# Patient Record
Sex: Female | Born: 1935 | Race: White | Hispanic: No | Smoking: Never smoker
Health system: Southern US, Community
[De-identification: ages and names within clinical notes are randomized; demographics above are authoritative.]

## PROBLEM LIST (undated history)

## (undated) DIAGNOSIS — Z86711 Personal history of pulmonary embolism: Secondary | ICD-10-CM

## (undated) DIAGNOSIS — H9190 Unspecified hearing loss, unspecified ear: Secondary | ICD-10-CM

## (undated) DIAGNOSIS — I10 Essential (primary) hypertension: Secondary | ICD-10-CM

## (undated) DIAGNOSIS — F0280 Dementia in other diseases classified elsewhere without behavioral disturbance: Secondary | ICD-10-CM

## (undated) DIAGNOSIS — M1990 Unspecified osteoarthritis, unspecified site: Secondary | ICD-10-CM

## (undated) DIAGNOSIS — N183 Chronic kidney disease, stage 3 unspecified: Secondary | ICD-10-CM

## (undated) DIAGNOSIS — E119 Type 2 diabetes mellitus without complications: Secondary | ICD-10-CM

## (undated) DIAGNOSIS — G309 Alzheimer's disease, unspecified: Secondary | ICD-10-CM

## (undated) DIAGNOSIS — E039 Hypothyroidism, unspecified: Secondary | ICD-10-CM

## (undated) DIAGNOSIS — K08109 Complete loss of teeth, unspecified cause, unspecified class: Secondary | ICD-10-CM

## (undated) DIAGNOSIS — N289 Disorder of kidney and ureter, unspecified: Secondary | ICD-10-CM

## (undated) DIAGNOSIS — K219 Gastro-esophageal reflux disease without esophagitis: Secondary | ICD-10-CM

## (undated) DIAGNOSIS — F419 Anxiety disorder, unspecified: Secondary | ICD-10-CM

## (undated) DIAGNOSIS — G4700 Insomnia, unspecified: Secondary | ICD-10-CM

## (undated) DIAGNOSIS — F32A Depression, unspecified: Secondary | ICD-10-CM

## (undated) DIAGNOSIS — Z973 Presence of spectacles and contact lenses: Secondary | ICD-10-CM

## (undated) DIAGNOSIS — R32 Unspecified urinary incontinence: Secondary | ICD-10-CM

## (undated) DIAGNOSIS — Z972 Presence of dental prosthetic device (complete) (partial): Secondary | ICD-10-CM

## (undated) DIAGNOSIS — I4891 Unspecified atrial fibrillation: Secondary | ICD-10-CM

## (undated) DIAGNOSIS — F329 Major depressive disorder, single episode, unspecified: Secondary | ICD-10-CM

---

## 2003-10-23 ENCOUNTER — Observation Stay (HOSPITAL_COMMUNITY): Admission: EM | Admit: 2003-10-23 | Discharge: 2003-10-26 | Payer: Self-pay | Admitting: Emergency Medicine

## 2003-11-29 ENCOUNTER — Ambulatory Visit: Payer: Self-pay | Admitting: Internal Medicine

## 2004-02-09 DIAGNOSIS — Z86711 Personal history of pulmonary embolism: Secondary | ICD-10-CM

## 2004-02-09 HISTORY — DX: Personal history of pulmonary embolism: Z86.711

## 2004-03-03 ENCOUNTER — Ambulatory Visit: Payer: Self-pay | Admitting: Internal Medicine

## 2004-03-03 ENCOUNTER — Encounter: Admission: RE | Admit: 2004-03-03 | Discharge: 2004-03-03 | Payer: Self-pay | Admitting: Internal Medicine

## 2004-03-17 ENCOUNTER — Ambulatory Visit: Payer: Self-pay

## 2004-03-17 ENCOUNTER — Encounter: Admission: RE | Admit: 2004-03-17 | Discharge: 2004-03-17 | Payer: Self-pay | Admitting: Internal Medicine

## 2004-03-24 ENCOUNTER — Ambulatory Visit: Payer: Self-pay | Admitting: Internal Medicine

## 2004-04-01 ENCOUNTER — Encounter: Admission: RE | Admit: 2004-04-01 | Discharge: 2004-04-01 | Payer: Self-pay | Admitting: Internal Medicine

## 2004-04-09 ENCOUNTER — Ambulatory Visit (HOSPITAL_COMMUNITY): Admission: RE | Admit: 2004-04-09 | Discharge: 2004-04-09 | Payer: Self-pay | Admitting: Internal Medicine

## 2004-04-14 ENCOUNTER — Ambulatory Visit (HOSPITAL_COMMUNITY): Admission: RE | Admit: 2004-04-14 | Discharge: 2004-04-14 | Payer: Self-pay | Admitting: Interventional Radiology

## 2004-04-14 ENCOUNTER — Encounter (INDEPENDENT_AMBULATORY_CARE_PROVIDER_SITE_OTHER): Payer: Self-pay | Admitting: Specialist

## 2004-04-20 ENCOUNTER — Ambulatory Visit: Payer: Self-pay | Admitting: Internal Medicine

## 2004-04-20 ENCOUNTER — Inpatient Hospital Stay (HOSPITAL_COMMUNITY): Admission: EM | Admit: 2004-04-20 | Discharge: 2004-04-29 | Payer: Self-pay | Admitting: Emergency Medicine

## 2004-04-30 ENCOUNTER — Ambulatory Visit: Payer: Self-pay | Admitting: Internal Medicine

## 2004-05-06 ENCOUNTER — Ambulatory Visit: Payer: Self-pay | Admitting: Internal Medicine

## 2004-05-15 ENCOUNTER — Ambulatory Visit: Payer: Self-pay | Admitting: Internal Medicine

## 2004-05-25 ENCOUNTER — Ambulatory Visit: Payer: Self-pay | Admitting: Internal Medicine

## 2004-06-24 ENCOUNTER — Ambulatory Visit: Payer: Self-pay | Admitting: Internal Medicine

## 2004-07-13 ENCOUNTER — Ambulatory Visit: Payer: Self-pay | Admitting: Internal Medicine

## 2004-07-23 ENCOUNTER — Ambulatory Visit: Payer: Self-pay | Admitting: Family Medicine

## 2004-09-15 ENCOUNTER — Ambulatory Visit: Payer: Self-pay | Admitting: Internal Medicine

## 2004-10-15 ENCOUNTER — Ambulatory Visit: Payer: Self-pay | Admitting: Family Medicine

## 2004-10-30 ENCOUNTER — Ambulatory Visit: Payer: Self-pay | Admitting: Internal Medicine

## 2004-11-13 ENCOUNTER — Ambulatory Visit: Payer: Self-pay | Admitting: Internal Medicine

## 2004-11-26 ENCOUNTER — Ambulatory Visit: Payer: Self-pay | Admitting: Internal Medicine

## 2004-12-10 ENCOUNTER — Ambulatory Visit: Payer: Self-pay | Admitting: Family Medicine

## 2005-01-06 ENCOUNTER — Ambulatory Visit: Payer: Self-pay | Admitting: Internal Medicine

## 2005-01-21 ENCOUNTER — Ambulatory Visit: Payer: Self-pay | Admitting: Internal Medicine

## 2005-01-21 ENCOUNTER — Inpatient Hospital Stay (HOSPITAL_COMMUNITY): Admission: EM | Admit: 2005-01-21 | Discharge: 2005-01-29 | Payer: Self-pay | Admitting: Emergency Medicine

## 2005-01-21 ENCOUNTER — Ambulatory Visit: Payer: Self-pay | Admitting: Physical Medicine & Rehabilitation

## 2005-03-04 ENCOUNTER — Ambulatory Visit: Payer: Self-pay | Admitting: Internal Medicine

## 2005-03-19 ENCOUNTER — Ambulatory Visit: Payer: Self-pay | Admitting: Internal Medicine

## 2005-04-05 ENCOUNTER — Ambulatory Visit: Payer: Self-pay | Admitting: Internal Medicine

## 2005-05-03 ENCOUNTER — Ambulatory Visit: Payer: Self-pay | Admitting: Internal Medicine

## 2005-05-10 ENCOUNTER — Ambulatory Visit: Payer: Self-pay | Admitting: Family Medicine

## 2005-06-09 ENCOUNTER — Ambulatory Visit: Payer: Self-pay | Admitting: Internal Medicine

## 2005-07-12 ENCOUNTER — Ambulatory Visit: Payer: Self-pay | Admitting: Internal Medicine

## 2005-07-21 ENCOUNTER — Ambulatory Visit: Payer: Self-pay | Admitting: Internal Medicine

## 2005-08-03 ENCOUNTER — Ambulatory Visit: Payer: Self-pay | Admitting: Internal Medicine

## 2005-11-22 ENCOUNTER — Ambulatory Visit: Payer: Self-pay | Admitting: Internal Medicine

## 2005-11-26 ENCOUNTER — Ambulatory Visit: Payer: Self-pay | Admitting: Internal Medicine

## 2005-11-26 LAB — CONVERTED CEMR LAB
Cholesterol: 165 mg/dL (ref 0–200)
HDL: 43.1 mg/dL (ref >39.0)
LDL Cholesterol: 93 mg/dL (ref 0–99)

## 2006-03-01 ENCOUNTER — Ambulatory Visit: Payer: Self-pay | Admitting: Internal Medicine

## 2006-03-01 LAB — CONVERTED CEMR LAB
CO2: 26 meq/L (ref 19–32)
Creatinine, Ser: 1.2 mg/dL (ref 0.4–1.2)
Glucose, Bld: 103 mg/dL — ABNORMAL HIGH (ref 70–99)
HCT: 44.2 % (ref 36.0–46.0)
Hemoglobin: 15.1 g/dL — ABNORMAL HIGH (ref 12.0–15.0)
MCHC: 34.2 g/dL (ref 30.0–36.0)
MCV: 96.8 fL (ref 78.0–100.0)
Potassium: 4.5 meq/L (ref 3.5–5.1)
Sodium: 134 meq/L — ABNORMAL LOW (ref 135–145)
WBC: 8.3 10*3/uL (ref 4.5–10.5)

## 2006-03-02 ENCOUNTER — Ambulatory Visit: Payer: Self-pay | Admitting: Internal Medicine

## 2006-03-03 ENCOUNTER — Ambulatory Visit: Payer: Self-pay | Admitting: Internal Medicine

## 2006-03-09 ENCOUNTER — Ambulatory Visit: Payer: Self-pay | Admitting: Internal Medicine

## 2006-05-05 ENCOUNTER — Ambulatory Visit: Payer: Self-pay | Admitting: Internal Medicine

## 2006-05-05 LAB — CONVERTED CEMR LAB
Bilirubin Urine: NEGATIVE
Nitrite: NEGATIVE
Protein, ur: NEGATIVE mg/dL
RBC / HPF: NONE SEEN (ref <3)
Specific Gravity, Urine: 1.012 (ref 1.005–1.03)
Urobilinogen, UA: 0.2 (ref 0.0–1.0)
WBC, UA: NONE SEEN cells/hpf (ref <3)

## 2006-06-01 DIAGNOSIS — M810 Age-related osteoporosis without current pathological fracture: Secondary | ICD-10-CM | POA: Insufficient documentation

## 2006-06-01 DIAGNOSIS — M1990 Unspecified osteoarthritis, unspecified site: Secondary | ICD-10-CM | POA: Insufficient documentation

## 2006-06-01 DIAGNOSIS — Z86718 Personal history of other venous thrombosis and embolism: Secondary | ICD-10-CM

## 2006-06-01 DIAGNOSIS — S72009A Fracture of unspecified part of neck of unspecified femur, initial encounter for closed fracture: Secondary | ICD-10-CM | POA: Insufficient documentation

## 2006-06-01 DIAGNOSIS — I10 Essential (primary) hypertension: Secondary | ICD-10-CM | POA: Insufficient documentation

## 2006-06-01 DIAGNOSIS — K219 Gastro-esophageal reflux disease without esophagitis: Secondary | ICD-10-CM

## 2006-06-01 DIAGNOSIS — E785 Hyperlipidemia, unspecified: Secondary | ICD-10-CM | POA: Insufficient documentation

## 2006-06-02 ENCOUNTER — Ambulatory Visit: Payer: Self-pay | Admitting: Internal Medicine

## 2006-06-02 LAB — CONVERTED CEMR LAB
Bilirubin Urine: NEGATIVE
Hemoglobin, Urine: NEGATIVE
Protein, ur: NEGATIVE mg/dL
Urine Glucose: NEGATIVE mg/dL
WBC, UA: NONE SEEN cells/hpf (ref <3)
pH: 7.5 (ref 5.0–8.0)

## 2006-06-15 ENCOUNTER — Ambulatory Visit: Payer: Self-pay | Admitting: Vascular Surgery

## 2006-06-15 ENCOUNTER — Encounter: Payer: Self-pay | Admitting: Vascular Surgery

## 2006-06-15 ENCOUNTER — Inpatient Hospital Stay (HOSPITAL_COMMUNITY): Admission: EM | Admit: 2006-06-15 | Discharge: 2006-06-17 | Payer: Self-pay | Admitting: Emergency Medicine

## 2006-06-15 ENCOUNTER — Ambulatory Visit: Payer: Self-pay | Admitting: Internal Medicine

## 2006-06-15 ENCOUNTER — Encounter: Payer: Self-pay | Admitting: Internal Medicine

## 2006-06-22 ENCOUNTER — Ambulatory Visit: Payer: Self-pay | Admitting: Internal Medicine

## 2006-06-22 LAB — CONVERTED CEMR LAB
Leukocytes, UA: NEGATIVE
Nitrite: NEGATIVE
Protein, ur: NEGATIVE mg/dL
Urine Glucose: NEGATIVE mg/dL
pH: 7.5 (ref 5.0–8.0)

## 2006-06-27 ENCOUNTER — Telehealth: Payer: Self-pay | Admitting: Internal Medicine

## 2006-07-08 ENCOUNTER — Encounter: Payer: Self-pay | Admitting: Internal Medicine

## 2006-07-08 ENCOUNTER — Ambulatory Visit: Payer: Self-pay | Admitting: Internal Medicine

## 2006-07-08 LAB — CONVERTED CEMR LAB
Bilirubin Urine: NEGATIVE
Blood in Urine, dipstick: NEGATIVE
Hemoglobin, Urine: NEGATIVE
Ketones, ur: NEGATIVE mg/dL
Ketones, urine, test strip: NEGATIVE
Leukocytes, UA: NEGATIVE
Nitrite: NEGATIVE
Nitrite: NEGATIVE
Protein, ur: NEGATIVE mg/dL
RBC / HPF: NONE SEEN
Specific Gravity, Urine: 1.011
Urine Glucose: NEGATIVE mg/dL
Urobilinogen, UA: NEGATIVE
Urobilinogen, UA: 0.2
WBC Urine, dipstick: NEGATIVE
WBC, UA: NONE SEEN {cells}/[HPF]
pH: 6
pH: 7.5

## 2006-07-10 LAB — CONVERTED CEMR LAB
CO2: 29 meq/L (ref 19–32)
Calcium: 9.3 mg/dL (ref 8.4–10.5)
Chloride: 105 meq/L (ref 96–112)
Creatinine, Ser: 1.1 mg/dL (ref 0.4–1.2)
GFR calc non Af Amer: 50 mL/min
Glucose, Bld: 100 mg/dL — ABNORMAL HIGH (ref 70–99)
Sodium: 139 meq/L (ref 135–145)

## 2006-07-28 ENCOUNTER — Telehealth: Payer: Self-pay | Admitting: Internal Medicine

## 2006-09-02 ENCOUNTER — Telehealth (INDEPENDENT_AMBULATORY_CARE_PROVIDER_SITE_OTHER): Payer: Self-pay | Admitting: *Deleted

## 2006-09-30 ENCOUNTER — Ambulatory Visit: Payer: Self-pay | Admitting: Internal Medicine

## 2006-09-30 DIAGNOSIS — M549 Dorsalgia, unspecified: Secondary | ICD-10-CM | POA: Insufficient documentation

## 2006-09-30 LAB — CONVERTED CEMR LAB
Bilirubin Urine: NEGATIVE
Nitrite: NEGATIVE
Protein, U semiquant: NEGATIVE
Urobilinogen, UA: NEGATIVE

## 2006-10-01 ENCOUNTER — Encounter: Payer: Self-pay | Admitting: Internal Medicine

## 2006-10-04 LAB — CONVERTED CEMR LAB

## 2006-10-05 ENCOUNTER — Emergency Department (HOSPITAL_COMMUNITY): Admission: EM | Admit: 2006-10-05 | Discharge: 2006-10-05 | Payer: Self-pay | Admitting: *Deleted

## 2006-10-07 ENCOUNTER — Ambulatory Visit: Payer: Self-pay | Admitting: Internal Medicine

## 2006-10-13 ENCOUNTER — Ambulatory Visit: Payer: Self-pay | Admitting: Internal Medicine

## 2006-10-17 ENCOUNTER — Ambulatory Visit: Payer: Self-pay | Admitting: Internal Medicine

## 2006-10-24 ENCOUNTER — Telehealth: Payer: Self-pay | Admitting: Internal Medicine

## 2006-10-31 ENCOUNTER — Telehealth (INDEPENDENT_AMBULATORY_CARE_PROVIDER_SITE_OTHER): Payer: Self-pay | Admitting: *Deleted

## 2006-11-02 ENCOUNTER — Telehealth: Payer: Self-pay | Admitting: Internal Medicine

## 2006-11-03 ENCOUNTER — Encounter (HOSPITAL_BASED_OUTPATIENT_CLINIC_OR_DEPARTMENT_OTHER): Admission: RE | Admit: 2006-11-03 | Discharge: 2006-11-08 | Payer: Self-pay | Admitting: Internal Medicine

## 2006-11-07 ENCOUNTER — Encounter: Payer: Self-pay | Admitting: Internal Medicine

## 2006-11-09 ENCOUNTER — Encounter (HOSPITAL_BASED_OUTPATIENT_CLINIC_OR_DEPARTMENT_OTHER): Admission: RE | Admit: 2006-11-09 | Discharge: 2006-12-07 | Payer: Self-pay | Admitting: Internal Medicine

## 2006-11-21 ENCOUNTER — Ambulatory Visit: Payer: Self-pay | Admitting: Internal Medicine

## 2006-11-21 LAB — CONVERTED CEMR LAB
Bilirubin Urine: NEGATIVE
Glucose, Urine, Semiquant: NEGATIVE
Specific Gravity, Urine: 1.01

## 2006-11-22 ENCOUNTER — Encounter: Payer: Self-pay | Admitting: Internal Medicine

## 2006-11-22 ENCOUNTER — Telehealth: Payer: Self-pay | Admitting: Internal Medicine

## 2006-11-29 ENCOUNTER — Encounter: Payer: Self-pay | Admitting: Internal Medicine

## 2006-12-08 ENCOUNTER — Telehealth (INDEPENDENT_AMBULATORY_CARE_PROVIDER_SITE_OTHER): Payer: Self-pay | Admitting: *Deleted

## 2006-12-17 ENCOUNTER — Ambulatory Visit: Payer: Self-pay | Admitting: *Deleted

## 2006-12-17 ENCOUNTER — Observation Stay (HOSPITAL_COMMUNITY): Admission: EM | Admit: 2006-12-17 | Discharge: 2006-12-19 | Payer: Self-pay | Admitting: Emergency Medicine

## 2007-01-03 ENCOUNTER — Inpatient Hospital Stay (HOSPITAL_COMMUNITY): Admission: EM | Admit: 2007-01-03 | Discharge: 2007-01-06 | Payer: Self-pay | Admitting: Emergency Medicine

## 2007-01-03 ENCOUNTER — Ambulatory Visit: Payer: Self-pay | Admitting: *Deleted

## 2007-01-03 ENCOUNTER — Ambulatory Visit: Payer: Self-pay | Admitting: Cardiology

## 2007-01-03 ENCOUNTER — Ambulatory Visit: Payer: Self-pay | Admitting: Internal Medicine

## 2007-01-03 DIAGNOSIS — I4891 Unspecified atrial fibrillation: Secondary | ICD-10-CM | POA: Insufficient documentation

## 2007-01-04 ENCOUNTER — Encounter: Payer: Self-pay | Admitting: Cardiology

## 2007-01-13 ENCOUNTER — Telehealth (INDEPENDENT_AMBULATORY_CARE_PROVIDER_SITE_OTHER): Payer: Self-pay | Admitting: *Deleted

## 2007-01-13 ENCOUNTER — Ambulatory Visit: Payer: Self-pay | Admitting: Cardiology

## 2007-01-13 LAB — CONVERTED CEMR LAB: Digitoxin Lvl: 0.9 ng/mL (ref 0.8–2.0)

## 2007-01-25 ENCOUNTER — Encounter: Payer: Self-pay | Admitting: Internal Medicine

## 2007-01-30 ENCOUNTER — Ambulatory Visit: Payer: Self-pay | Admitting: Internal Medicine

## 2007-02-03 ENCOUNTER — Telehealth (INDEPENDENT_AMBULATORY_CARE_PROVIDER_SITE_OTHER): Payer: Self-pay | Admitting: *Deleted

## 2007-02-17 ENCOUNTER — Telehealth (INDEPENDENT_AMBULATORY_CARE_PROVIDER_SITE_OTHER): Payer: Self-pay | Admitting: *Deleted

## 2007-03-03 ENCOUNTER — Telehealth (INDEPENDENT_AMBULATORY_CARE_PROVIDER_SITE_OTHER): Payer: Self-pay | Admitting: *Deleted

## 2007-03-14 ENCOUNTER — Ambulatory Visit: Payer: Self-pay | Admitting: Internal Medicine

## 2007-03-14 DIAGNOSIS — R634 Abnormal weight loss: Secondary | ICD-10-CM | POA: Insufficient documentation

## 2007-03-14 DIAGNOSIS — F0391 Unspecified dementia with behavioral disturbance: Secondary | ICD-10-CM | POA: Insufficient documentation

## 2007-03-14 LAB — CONVERTED CEMR LAB
Bilirubin Urine: NEGATIVE
Glucose, Urine, Semiquant: NEGATIVE
Specific Gravity, Urine: <1.005
pH: 7.5

## 2007-03-15 ENCOUNTER — Encounter: Payer: Self-pay | Admitting: Internal Medicine

## 2007-03-15 LAB — CONVERTED CEMR LAB: RBC / HPF: NONE SEEN (ref <3)

## 2007-03-16 ENCOUNTER — Encounter: Payer: Self-pay | Admitting: Internal Medicine

## 2007-03-17 ENCOUNTER — Telehealth (INDEPENDENT_AMBULATORY_CARE_PROVIDER_SITE_OTHER): Payer: Self-pay | Admitting: *Deleted

## 2007-03-17 LAB — CONVERTED CEMR LAB
ALT: 20 units/L (ref 0–35)
Alkaline Phosphatase: 78 units/L (ref 39–117)
BUN: 11 mg/dL (ref 6–23)
Basophils Absolute: 0 10*3/uL (ref 0.0–0.1)
Basophils Relative: 0.1 % (ref 0.0–1.0)
CO2: 30 meq/L (ref 19–32)
Calcium: 9.7 mg/dL (ref 8.4–10.5)
Chloride: 103 meq/L (ref 96–112)
Creatinine, Ser: 1.2 mg/dL (ref 0.4–1.2)
Eosinophils Relative: 0.9 % (ref 0.0–5.0)
GFR calc Af Amer: 55 mL/min
HCT: 42.5 % (ref 36.0–46.0)
Neutrophils Relative %: 24.3 % — ABNORMAL LOW (ref 43.0–77.0)
RBC: 4.53 M/uL (ref 3.87–5.11)
RDW: 12.8 % (ref 11.5–14.6)
Saturation Ratios: 15.7 % — ABNORMAL LOW (ref 20.0–50.0)
Total Protein: 7.2 g/dL (ref 6.0–8.3)
WBC: 2.4 10*3/uL — ABNORMAL LOW (ref 4.5–10.5)

## 2007-03-22 ENCOUNTER — Telehealth (INDEPENDENT_AMBULATORY_CARE_PROVIDER_SITE_OTHER): Payer: Self-pay | Admitting: *Deleted

## 2007-03-23 ENCOUNTER — Ambulatory Visit: Payer: Self-pay | Admitting: Internal Medicine

## 2007-03-27 ENCOUNTER — Ambulatory Visit: Payer: Self-pay | Admitting: Internal Medicine

## 2007-03-27 ENCOUNTER — Telehealth (INDEPENDENT_AMBULATORY_CARE_PROVIDER_SITE_OTHER): Payer: Self-pay | Admitting: *Deleted

## 2007-03-30 ENCOUNTER — Telehealth: Payer: Self-pay | Admitting: Internal Medicine

## 2007-04-05 ENCOUNTER — Encounter: Payer: Self-pay | Admitting: Internal Medicine

## 2007-04-06 ENCOUNTER — Ambulatory Visit: Payer: Self-pay | Admitting: Internal Medicine

## 2007-04-06 LAB — CONVERTED CEMR LAB
Bilirubin Urine: NEGATIVE
Nitrite: NEGATIVE
Specific Gravity, Urine: <1.005
Urobilinogen, UA: NEGATIVE
pH: 7.5

## 2007-04-07 ENCOUNTER — Telehealth (INDEPENDENT_AMBULATORY_CARE_PROVIDER_SITE_OTHER): Payer: Self-pay | Admitting: *Deleted

## 2007-04-07 ENCOUNTER — Encounter: Payer: Self-pay | Admitting: Internal Medicine

## 2007-04-10 ENCOUNTER — Telehealth: Payer: Self-pay | Admitting: Internal Medicine

## 2007-04-11 ENCOUNTER — Telehealth (INDEPENDENT_AMBULATORY_CARE_PROVIDER_SITE_OTHER): Payer: Self-pay | Admitting: *Deleted

## 2007-04-11 ENCOUNTER — Encounter (INDEPENDENT_AMBULATORY_CARE_PROVIDER_SITE_OTHER): Payer: Self-pay | Admitting: *Deleted

## 2007-04-11 LAB — CONVERTED CEMR LAB
HCT: 40.4 % (ref 36.0–46.0)
MCHC: 32.9 g/dL (ref 30.0–36.0)
MCV: 93.4 fL (ref 78.0–100.0)
WBC: 5.8 10*3/uL (ref 4.5–10.5)

## 2007-04-18 ENCOUNTER — Telehealth (INDEPENDENT_AMBULATORY_CARE_PROVIDER_SITE_OTHER): Payer: Self-pay | Admitting: *Deleted

## 2007-04-21 ENCOUNTER — Ambulatory Visit: Payer: Self-pay | Admitting: Internal Medicine

## 2007-04-24 ENCOUNTER — Telehealth (INDEPENDENT_AMBULATORY_CARE_PROVIDER_SITE_OTHER): Payer: Self-pay | Admitting: *Deleted

## 2007-05-02 ENCOUNTER — Telehealth: Payer: Self-pay | Admitting: Internal Medicine

## 2007-05-09 ENCOUNTER — Encounter: Payer: Self-pay | Admitting: Internal Medicine

## 2007-05-09 ENCOUNTER — Telehealth: Payer: Self-pay | Admitting: Internal Medicine

## 2007-05-11 ENCOUNTER — Encounter: Payer: Self-pay | Admitting: Internal Medicine

## 2007-05-17 ENCOUNTER — Telehealth (INDEPENDENT_AMBULATORY_CARE_PROVIDER_SITE_OTHER): Payer: Self-pay | Admitting: *Deleted

## 2007-06-08 ENCOUNTER — Encounter: Payer: Self-pay | Admitting: Internal Medicine

## 2007-06-08 LAB — CONVERTED CEMR LAB
Bilirubin Urine: NEGATIVE
Ketones, urine, test strip: NEGATIVE
Protein, U semiquant: NEGATIVE
Urobilinogen, UA: NEGATIVE

## 2007-06-09 ENCOUNTER — Ambulatory Visit: Payer: Self-pay | Admitting: Internal Medicine

## 2007-06-09 DIAGNOSIS — N390 Urinary tract infection, site not specified: Secondary | ICD-10-CM | POA: Insufficient documentation

## 2007-06-09 LAB — CONVERTED CEMR LAB

## 2007-06-12 ENCOUNTER — Encounter: Payer: Self-pay | Admitting: Internal Medicine

## 2007-06-15 ENCOUNTER — Encounter: Payer: Self-pay | Admitting: Internal Medicine

## 2007-06-19 ENCOUNTER — Telehealth (INDEPENDENT_AMBULATORY_CARE_PROVIDER_SITE_OTHER): Payer: Self-pay | Admitting: *Deleted

## 2007-06-27 ENCOUNTER — Telehealth: Payer: Self-pay | Admitting: Internal Medicine

## 2007-06-30 ENCOUNTER — Encounter: Payer: Self-pay | Admitting: Internal Medicine

## 2007-07-07 ENCOUNTER — Encounter: Payer: Self-pay | Admitting: Internal Medicine

## 2007-07-10 ENCOUNTER — Encounter (INDEPENDENT_AMBULATORY_CARE_PROVIDER_SITE_OTHER): Payer: Self-pay | Admitting: *Deleted

## 2007-07-13 ENCOUNTER — Encounter (INDEPENDENT_AMBULATORY_CARE_PROVIDER_SITE_OTHER): Payer: Self-pay | Admitting: *Deleted

## 2007-07-13 ENCOUNTER — Encounter: Payer: Self-pay | Admitting: Internal Medicine

## 2007-07-13 ENCOUNTER — Telehealth: Payer: Self-pay | Admitting: Internal Medicine

## 2007-07-14 ENCOUNTER — Ambulatory Visit: Payer: Self-pay | Admitting: Internal Medicine

## 2007-07-14 DIAGNOSIS — R609 Edema, unspecified: Secondary | ICD-10-CM

## 2007-07-14 DIAGNOSIS — L8991 Pressure ulcer of unspecified site, stage 1: Secondary | ICD-10-CM | POA: Insufficient documentation

## 2007-07-14 LAB — CONVERTED CEMR LAB
Glucose, Urine, Semiquant: NEGATIVE
Protein, U semiquant: NEGATIVE
Specific Gravity, Urine: 1.01

## 2007-07-15 ENCOUNTER — Encounter: Payer: Self-pay | Admitting: Internal Medicine

## 2007-07-17 ENCOUNTER — Encounter: Payer: Self-pay | Admitting: Internal Medicine

## 2007-08-02 ENCOUNTER — Telehealth: Payer: Self-pay | Admitting: Internal Medicine

## 2007-08-02 ENCOUNTER — Encounter: Payer: Self-pay | Admitting: Internal Medicine

## 2007-08-06 ENCOUNTER — Inpatient Hospital Stay (HOSPITAL_COMMUNITY): Admission: EM | Admit: 2007-08-06 | Discharge: 2007-08-22 | Payer: Self-pay | Admitting: Emergency Medicine

## 2007-08-06 ENCOUNTER — Ambulatory Visit: Payer: Self-pay | Admitting: Vascular Surgery

## 2007-08-06 ENCOUNTER — Encounter: Payer: Self-pay | Admitting: Internal Medicine

## 2007-08-06 ENCOUNTER — Ambulatory Visit: Payer: Self-pay | Admitting: Internal Medicine

## 2007-08-07 ENCOUNTER — Encounter (INDEPENDENT_AMBULATORY_CARE_PROVIDER_SITE_OTHER): Payer: Self-pay | Admitting: *Deleted

## 2007-08-09 ENCOUNTER — Encounter: Payer: Self-pay | Admitting: Internal Medicine

## 2007-08-25 ENCOUNTER — Telehealth: Payer: Self-pay | Admitting: Internal Medicine

## 2007-08-28 ENCOUNTER — Emergency Department (HOSPITAL_COMMUNITY): Admission: EM | Admit: 2007-08-28 | Discharge: 2007-08-28 | Payer: Self-pay | Admitting: Emergency Medicine

## 2007-08-31 ENCOUNTER — Telehealth (INDEPENDENT_AMBULATORY_CARE_PROVIDER_SITE_OTHER): Payer: Self-pay | Admitting: *Deleted

## 2009-03-18 IMAGING — CT CT HEAD W/O CM
1 of 2 series · 16 of 30 positions shown, 20 images · non-contrast
Comparison: CT brain of 04/21/2007

CLINICAL DATA: Patient fell with multiple bruises, history of
dimension

CT HEAD WITHOUT CONTRAST
TECHNIQUE: Contiguous axial images were obtained from the base of
the skull through the vertex without contrast.

[Series 3: recon 2: brain · axial · 0.47mm/px · z∈[+99,+239]mm · 16 of 64 slices shown, 20 images]
[im 4/64  brain]
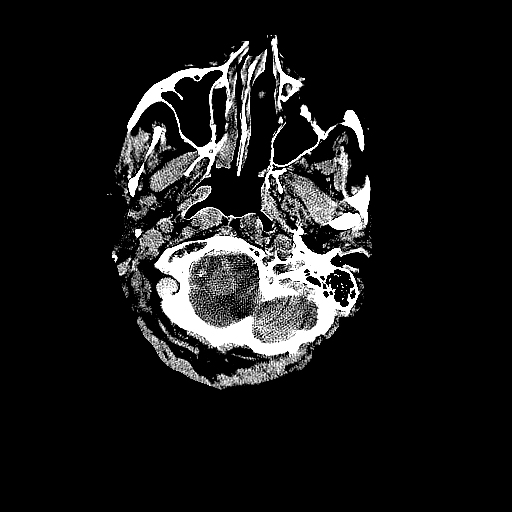
[im 4/64  bone]
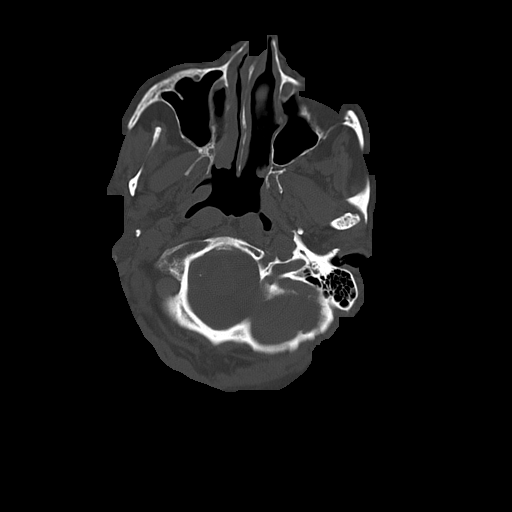
[im 7/64  brain]
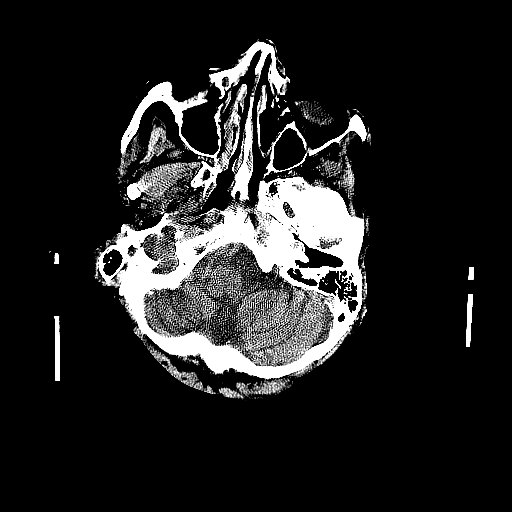
[im 10/64  brain]
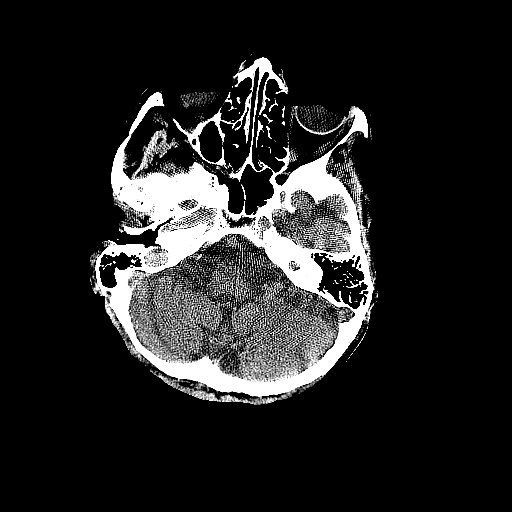
[im 14/64  brain]
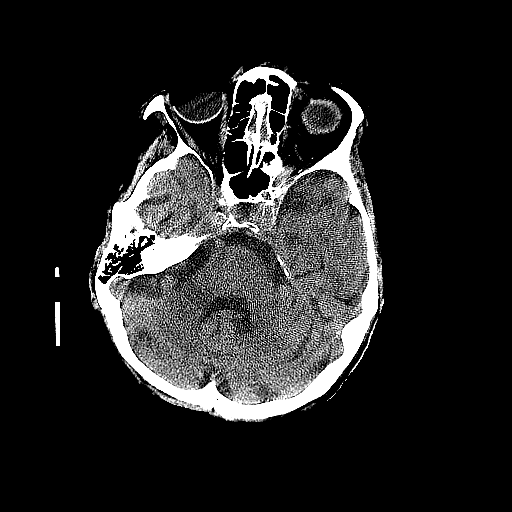
[im 17/64  brain]
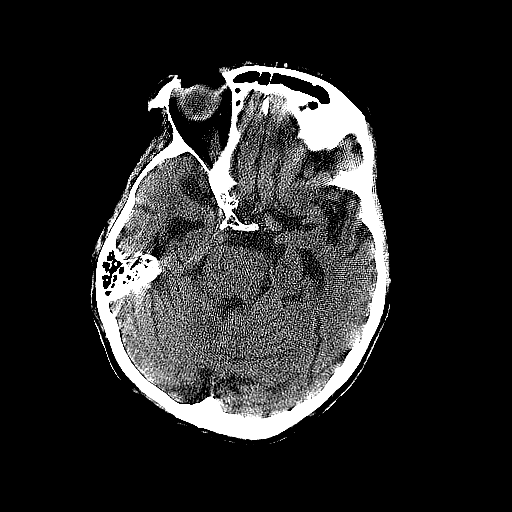
[im 17/64  bone]
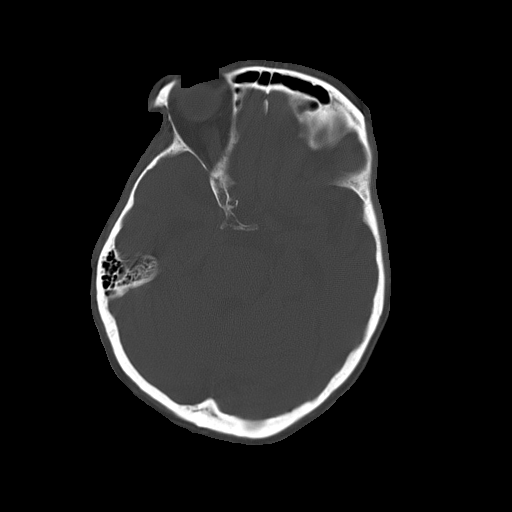
[im 20/64  brain]
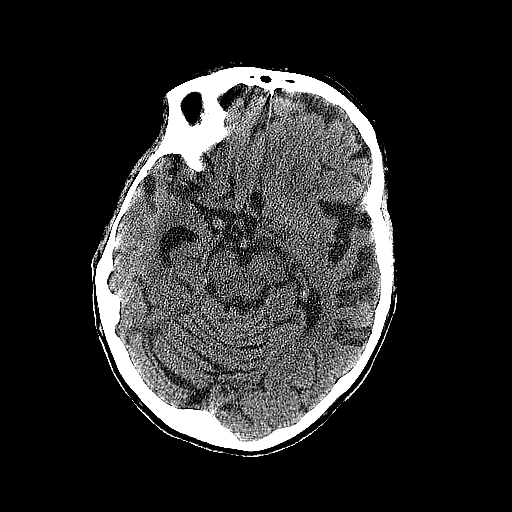
[im 24/64  brain]
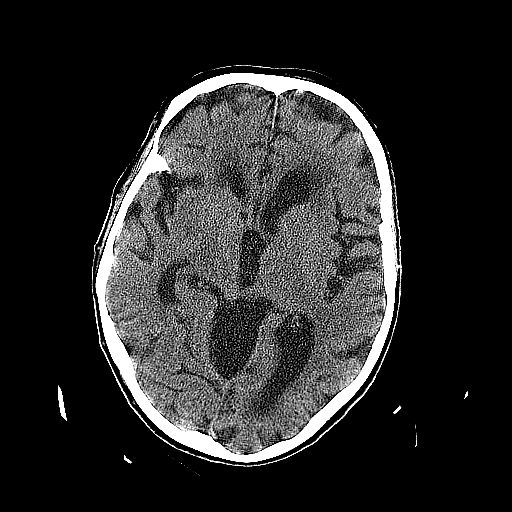
[im 27/64  brain]
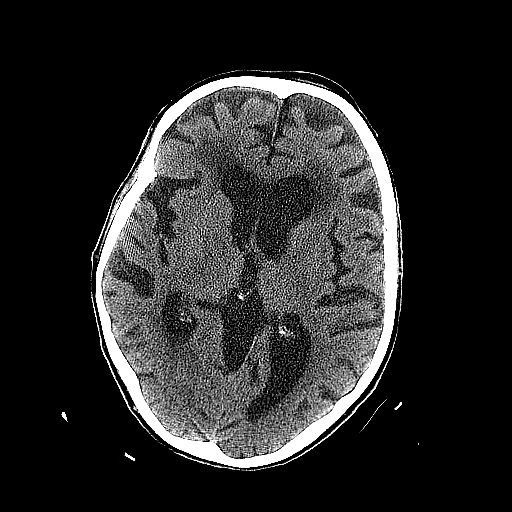
[im 34/64  brain]
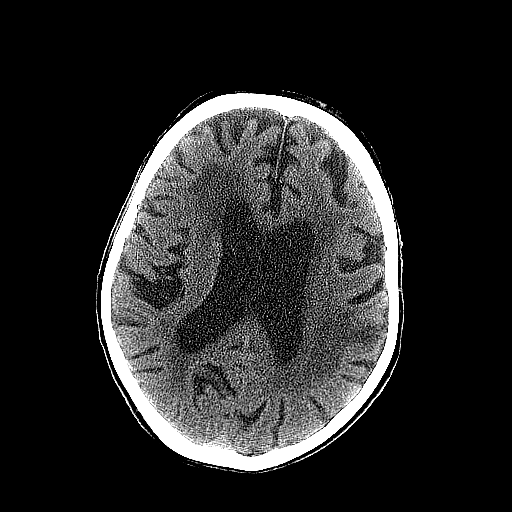
[im 34/64  bone]
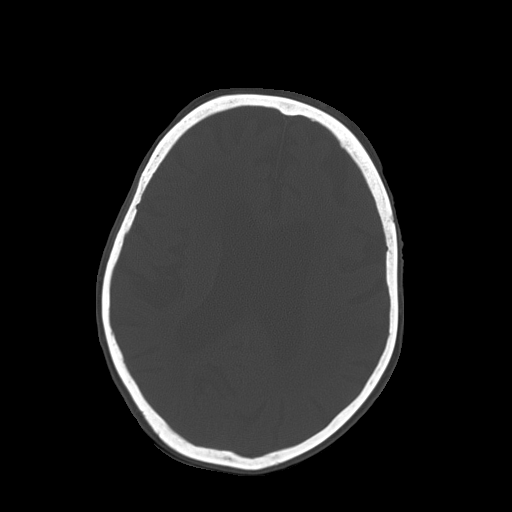
[im 37/64  brain]
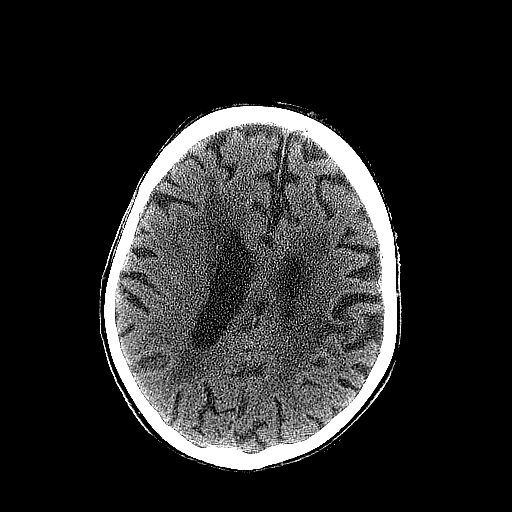
[im 40/64  brain]
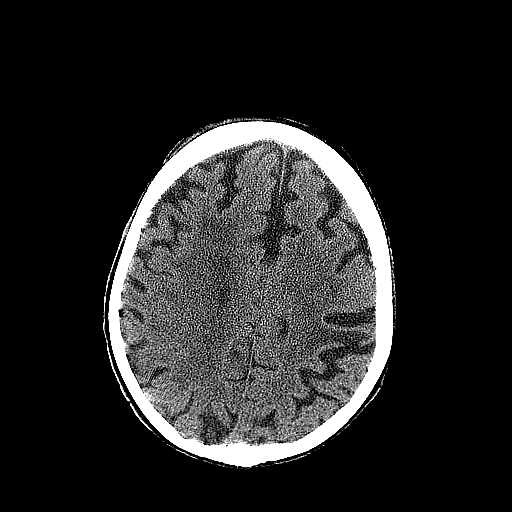
[im 44/64  brain]
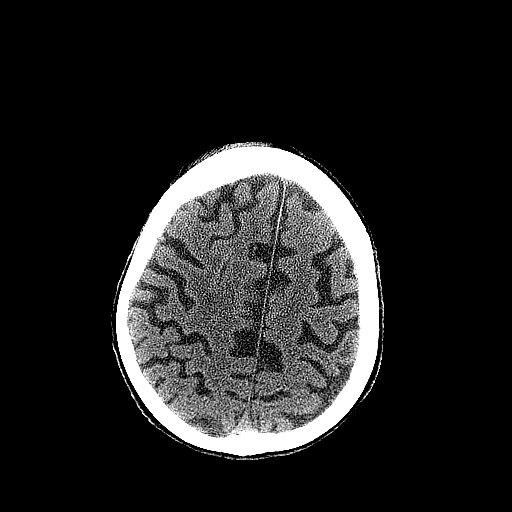
[im 47/64  brain]
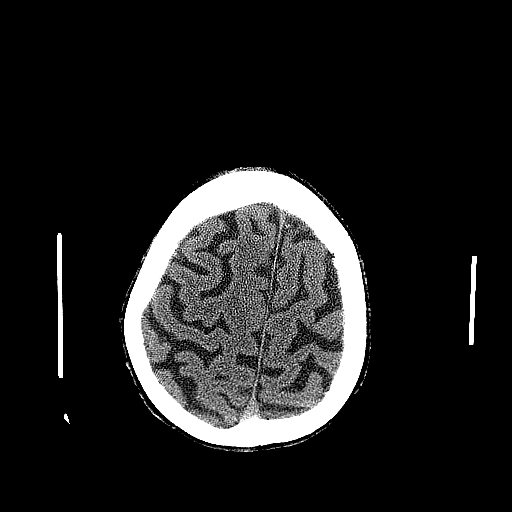
[im 47/64  bone]
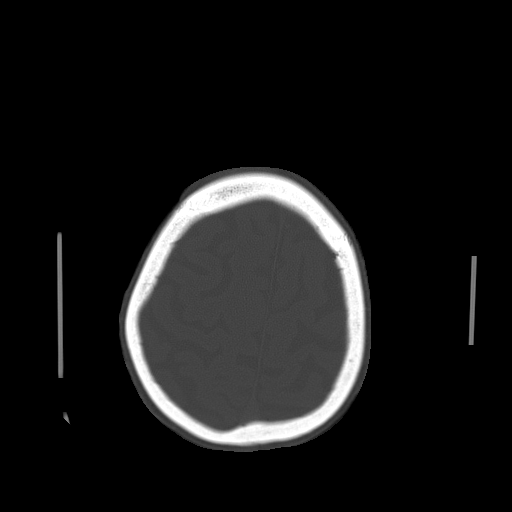
[im 50/64  brain]
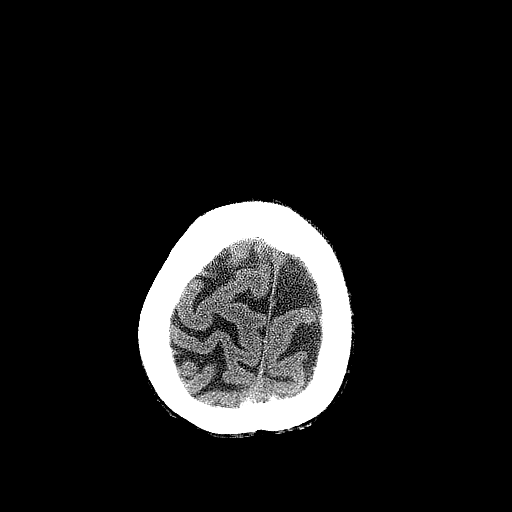
[im 54/64  brain]
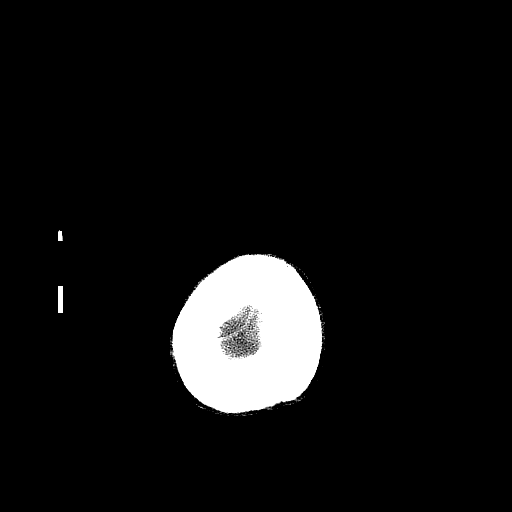
[im 57/64  brain]
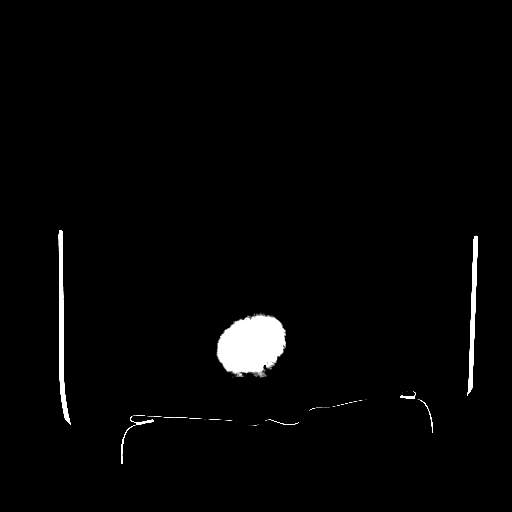

[16 of 30 positions shown; findings below may reference images not displayed]

FINDINGS: The ventricular system remains diffusely prominent as are
the cortical sulci indicative of atrophy.  Moderate small vessel
ischemic change is noted throughout the periventricular white
matter.  The septum is midline position of the fourth ventricle and
basilar cisterns are stable.  No blood, edema, or mass effect is
seen.  No acute bony abnormality is noted.
IMPRESSION: Atrophy and moderate small vessel ischemic change.  No acute
abnormality.

## 2010-03-01 ENCOUNTER — Encounter: Payer: Self-pay | Admitting: Internal Medicine

## 2010-03-01 ENCOUNTER — Encounter: Payer: Self-pay | Admitting: Interventional Radiology

## 2010-06-23 NOTE — Assessment & Plan Note (Signed)
[facility]   NAME:  ROSAISELA, JAMROZ NO.:  [id number]   MEDICAL RECORD NO.:  [id number]      DATE OF BIRTH:  [birth year]   PHYSICIAN:  Maxwell Caul, M.D.      VISIT DATE:                                   OFFICE VISIT   Mrs. Yontz [age] comes in follow-up for her traumatic wound to her right  posterior arm.  We last saw this 2 weeks ago.  She has continued with  antibacterial soap, Polysporin nonadherent dressing, and an ACE wrap   She continues to do well. Reports no pain or fever and no drainage.   PHYSICAL EXAMINATION:  On examination of the right arm the area in  question is healed nicely.  There are no open areas here.  Her skin  remains very thin and at risk for further traumatic damage.  She also  has venous stasis in her lower extremities, but there are no open areas  here.   IMPRESSION:  Traumatic wound to the right arm, now resolved.   She is discharged at this point. We will be glad to see her back p.r.n.           ______________________________  Maxwell Caul, M.D.     MGR/MEDQ  D:  11/25/2006  T:  11/28/2006  Job:  [id number]

## 2010-06-23 NOTE — Discharge Summary (Signed)
[name], [name]                ACCOUNT NO.:  [id number]   MEDICAL RECORD NO.:  [id number]          PATIENT TYPE:  INP   LOCATION:  5525                         FACILITY:  [facility]   PHYSICIAN:  Sean A. Everardo All, MD    DATE OF BIRTH:  Oct 05, 1920   DATE OF ADMISSION:  08/05/2007  DATE OF DISCHARGE:                               DISCHARGE SUMMARY   ADDENDUM   [name]'s creatinine was noted to be slightly elevated today to 4.68.  We did ask Nephrology to evaluate the patient.  Their recommendations at  this time are to discontinue calcium, to aggressively hydrate orally  over the weekend, and to check BMET on Monday morning.  Dr. Briant Cedar  felt it was okay from a renal standpoint for the patient to go to the  skilled nursing facility today.      Sandford Craze, NP      Gregary Signs A. Everardo All, MD     MO/MEDQ  D:  08/18/2007  T:  08/19/2007  Job:  [id number]

## 2010-06-23 NOTE — Discharge Summary (Signed)
NAMEKEYLIN, [name]:  [id number]   MEDICAL RECORD NO.:  [id number]          PATIENT TYPE:  INP   LOCATION:  5525                         FACILITY:  [facility]   PHYSICIAN:  Sandford Craze, NP DATE OF BIRTH:  1920/04/25   DATE OF ADMISSION:  08/05/2007  DATE OF DISCHARGE:  08/21/2007                               DISCHARGE SUMMARY   DISCHARGE DIAGNOSES:  1. Sepsis in setting of left lower extremity cellulitis and multidrug-      resistant Escherichia coli urinary tract infection.  2. Hypokalemia.  3. Hypertension.  4. Dementia.  5. Hypernatremia.  6. Peripheral vascular disease.  7. Atrial fibrillation.  8. Buttock wound, status post wound care evaluation during this      admission including excisional sharp debridement.  Upper buttock      wound was noted on August 17, 2007, to have 0.5 x 0.5 cm width was 1      cm depth and the lower buttock wound was 2 x 1 x 2 cm.   HISTORY OF PRESENT ILLNESS:  Ms. Lindo is an 77 year old white female who  was admitted on August 05, 2007, with chief complaint of left leg  swelling.  She has a past medical history of atrial fibrillation,  hypertension, and history of PE who was admitted with increasing left  lower extremity edema.  According to the son, she had had a fever at the  nursing home and had not been acting her usual self.  She is apparently  normally conversant and can perform activities of daily living.  She was  admitted for further evaluation and treatment.   PAST MEDICAL HISTORY:  1. Hypertension.  2. Hyperlipidemia.  3. Atrial fibrillation, not felt to be candidate for anticoagulation.  4. GERD.  5. Osteoarthritis.  6. Osteoporosis.  7. Chronic back pain.  8. History of renal insufficiency.  9. History of supraventricular tachycardia.  10.History of mild-to-moderate mitral regurge.   COURSE OF HOSPITALIZATION:  1. Sepsis in setting of left lower extremity cellulitis and multidrug-      resistant E.  coli urinary tract infection.  The patient was      admitted and was placed on IV antibiotics, which included Primaxin      and vancomycin.  The vancomycin was discontinued as blood cultures      remain no growth to date.  She continued to improve slowly from a      clinical standpoint.  However, at this time appears to be      approaching her baseline level of dementia.  She does however      remain deconditioned, will need additional physical therapy at the      facility.  2. Hypokalemia.  The patient was known to have hypokalemia during this      admission and was repleted, potassium levels at time of discharge      were stable.  3. She did have hypernatremia, which does resolved with hydration.  4. Hypertension.  The patient's blood pressure is currently controlled      on current medication.  5. Atrial  fibrillation.  The patient's rate is stable.  Plan to      continue current meds.  She is not considered to be an      anticoagulation candidate.   An attempt was made during this admission to have a Palliative Care of  care consult with between the Palliative Care Team and the family;  however, the son was unable to arrange a time, which worked with his  schedule.  As her long-term prognosis is likely poor and severe advanced  dementia, current bed bound status, and multiple wounds; they could  likely benefit from Palliative Care Team to follow at the facility for  ongoing goals of care discussion.   PHYSICAL EXAMINATION:  VITAL SIGNS:  BP 160/94, heart rate 63,  respiratory rate 19, temp 97.5, and O2 sat 99% on 2 L.  GENERAL:  The patient is an elderly white female who is sleeping.  She  is easily arousable and in no acute distress.  CARDIOVASCULAR:  S1 and S2.  Regular rate and rhythm.  Trace bilateral  lower extremity edema is noted.  RESPIRATORY:  Breath sounds are clear to auscultation without wheezes,  rales, or rhonchi.  No increased work of breathing.  ABDOMEN:  Soft,  nontender, and nondistended.  Positive bowel sounds are  noted.  No palpable masses.  PSYCHIATRIC:  The patient is calm and pleasantly demented.  EXTREMITIES:  She is noted to have some mild ecchymosis in the left  lower extremity in the area of prior cellulitis.   DISCHARGE MEDICATIONS:  1. Klonopin 0.5 mg p.o. b.i.d.  2. Digoxin 0.125 mg p.o. daily.  3. Fentanyl to be applied to the affected wound.  4. Catapres patch 0.2 it would be changed every 7 days.  5. Lopressor 50 mg p.o. b.i.d.  6. Hydrochlorothiazide 12.5 mg p.o. daily.  7. Ativan 0.5 mg p.o. q.4 hours as needed.  8. Tylenol to 650 mg p.o. q.6 hours as needed.  9. Aspirin 325 mg p.o. daily.  10.Calcium 16 mg p.o. daily.  11.Prilosec 40 mg p.o. daily.  12.Multivitamins 1 tablet p.o. daily.  13.Simvastatin 20 mg p.o. daily in the evening.   PERTINENT LABORATORIES AT TIME OF DISCHARGE:  Hemoglobin 11.6,  hematocrit 34.5, white blood cell count 7.7, and platelets 299.  BUN 26,  creatinine 1.1, sodium 139, and potassium 4.2.   DISPOSITION:  The patient would be discharged to Skilled Nursing  Facility versus Assisted-Living Facility and they feel comfortable with  the patient returning under current level of need.   DIET AT TIME OF DISCHARGE:  Low-sodium, heart-healthy diet.   FOLLOWUP:  The patient is scheduled to follow up with Dr. Willow Ora on  Friday, September 01, 2007, at 11 a.m.      Sandford Craze, NP     MO/MEDQ  D:  08/21/2007  T:  08/21/2007  Job:  [id number]

## 2010-06-23 NOTE — Consult Note (Signed)
[name], [name]:  [id number]   MEDICAL RECORD NO.:  [id number]         PATIENT TYPE:  CINP   LOCATION:                               FACILITY:  [facility]   PHYSICIAN:  Cherylynn Ridges, M.D.    DATE OF BIRTH:  November 13, 1920   DATE OF CONSULTATION:  08/21/2007  DATE OF DISCHARGE:                                 CONSULTATION   REQUESTING PHYSICIAN:  Valerie A. Felicity Coyer, MD   CONSULTING SURGEON:  Dr. Lindie Spruce.   NEPHROLOGIST:  Maree Krabbe, M.D.   PRIMARY CARE PHYSICIAN:  Willow Ora, MD   REASON FOR CONSULTATION:  Right buttock decubitus.   HISTORY OF PRESENT ILLNESS:  [name] is an 77 year old white female  with advanced dementia, hypertension, atrial fibrillation, history of  renal insufficiency, history of supraventricular tachycardia, as well as  mild-to-moderate mitral regurgitation who presented to the Emergency  Department on August 06, 2007, from her [facility] with  left lower extremity edema.  Once she was here at the Emergency  Department, she was found to have a left lower extremity cellulitis as  well as pyelonephritis.  Her son at this time states that his mom used  to perform all activities of daily living mostly by herself, however,  her status had decreased over the past several days before coming to the  ER.  Also upon admission, she was found to have 2 small right buttock  decubitus.  Since being here, the patient has been treated for her  pyelonephritis as well as her left lower extremity cellulitis.  She has  been using also Santyl cream on her right buttock decubitus.  Because of  the shallowness of these wounds, they are unable to be packed at this  time.  Wound ostomy care nurse has also been following the patient for  these wounds as well.  Because of the amount of fibrin and eschar, we  were consulted for possible surgical debridement.   REVIEW OF SYSTEMS:  Unable to be obtained due to the patient's advanced  dementia.   FAMILY HISTORY:  Noncontributory.   PAST MEDICAL HISTORY:  1. Hypertension.  2. Hyperlipidemia.  3. Atrial fibrillation, for which she is not a candidate for      anticoagulation, therefore she is not on Coumadin.  4. GERD.  5. Osteoarthritis.  6. Osteoporosis.  7. Chronic back pain.  8. History of renal insufficiency.  9. History of SVT.  10.Mild-to-moderate mitral regurgitation.   PAST SURGICAL HISTORY:  According to the computer in December 2006, the  patient had a left hip hemiarthroplasty, by Dr. Otelia Sergeant, otherwise, the  patient is unable to tell me if she has had any other prior surgeries.   SOCIAL HISTORY:  The patient lives at [facility].  She  does have at least a son and there are no reports of her smoking.   ALLERGIES:  NKDA.   MEDICATIONS:  1. Aspirin 325 mg daily.  2. Calcium 600 mg daily.  3. Clonazepam 0.5 mg daily.  4. Clonidine 0.2 mg 3 times a day.  5.  Hydrochlorothiazide 12.5 mg daily.  6. Metoprolol 50 mg b.i.d.  7. Multivitamin daily.  8. Prilosec 40 mg daily.  9. Simvastatin 20 mg at bedtime.  10.Digoxin 125 mcg daily.   PHYSICAL EXAMINATION:  GENERAL:  This is a very pleasant 77 year old  white female who is pleasantly confused, lying in bed, in no acute  distress.  VITAL SIGNS:  Temperature 97.5, pulse 63, respirations 19, and blood  pressure 160/94.  EYES:  Sclerae noninjected.  Pupils are equal, round, and reactive to  light.  EARS, NOSE, MOUTH, AND THROAT:  Ears and nose with no obvious rashes or  lesions.  No rhinorrhea.  Mouth is pink and moist.  Throat shows no  exudate.  NECK:  Supple.  Trachea is midline.  No thyromegaly.  HEART:  Regular rate and rhythm.  Normal S1 and S2.  No murmurs,  gallops, or rubs.  A 2+ carotid pedal and radial pulses bilaterally.  LUNGS:  Clear to auscultation bilaterally with no wheezes, rhonchi, or  rales noted.  Respiratory effort is nonlabored.  ABDOMEN:  Soft, nontender, and  nondistended with active bowel sounds.  There are no other masses, hernias, or peritoneal signs.  MUSCULOSKELETAL:  All 4 extremities are symmetrical, however, her left  lower extremity does show some erythema related to her cellulitis.  Otherwise, there is no edema or cyanosis or clubbing.  SKIN:  There are no obvious massive lesions or rashes, however, her  right buttock area shows 2 small decubitus.  The top decubitus is  approximately 0.5 x 0.5 cm with 100% slough.  There is no drainage or  odor from this wound.  The bottom wound is approximately 2 x 1 x 2 cm  with 100% eschar with also no drainage or odor either.  Otherwise, there  is erythema surrounding both of these wounds.  There was also some  bleeding with manipulation of the wounds noted.  These measurements were  taken from the wound ostomy care nurses' note.  NEUROLOGIC:  Cranial nerves II through XII appeared to be grossly  intact.  The deep tendon reflex exam is deferred at this time.  PSYCHIATRY:  The patient is alert; however, due to her advanced  dementia, she is not oriented.   LABS AND DIAGNOSTICS:  CBC today revealed a white blood cell count of  7700, hemoglobin of 11.6, hematocrit 34.5, and platelets were 299,000.  BMET done on August 19, 2007, revealed a sodium of 139, potassium 4.2,  chloride 108, CO2 23, glucose 85, BUN 26, and creatinine 1.11.  There  are no diagnostics at this time.   IMPRESSION:  1. Right buttock decubitus, which are unstageable due to 100% eschar      and fibrin.  2. Pyelonephritis.  3. Left lower extremity cellulitis.  4. Renal insufficiency.  5. Hypertension.   PLAN:  At this time, these wounds do not appear to be infected, however,  due to the fibrin as well as eschar on these wounds, I think that the  patient would benefit from a possible bedside debridement.  Getting the  fibrin and eschar removed from these wounds would allow them to more  appropriately heal.  However, there has  been some talk in the chart of  the patient becoming palliative or hospice care.  If that does occur and  the patient's family no longer wishes surgical intervention to be  necessary, then I would recommend continued use of this Santyl cream as  well as comfort care measures for  these wounds.  Otherwise, I would  continue the use of the air mattress overlay.  Otherwise, any further  recommendations will be made per Dr. Lindie Spruce.      Letha Cape, PA      Cherylynn Ridges, M.D.  Electronically Signed    KEO/MEDQ  D:  08/21/2007  T:  08/21/2007  Job:  [id number]   cc:   Vikki Ports A. Felicity Coyer, MD  Willow Ora, MD  Maree Krabbe, M.D.

## 2010-06-23 NOTE — Assessment & Plan Note (Signed)
[facility]                            CARDIOLOGY OFFICE NOTE   NAME:Heath, Sandy MCCULLARS                       MRN:          [id number]  DATE:01/13/2007                            DOB:          01-13-1921    PRIMARY CARE PHYSICIAN:  Willow Ora, M.D.   REASON FOR PRESENTATION:  Evaluate patient with atrial fibrillation.   HISTORY OF PRESENT ILLNESS:  The patient was hospitalized from November  25th to November 28th for atrial fibrillation with rapid response.  It  was decided at that time that she was high risk for falls and would not  be started on Coumadin.  There was also difficulty maintaining a  therapeutic INR.  She was treated with rate control including digoxin  and metoprolol.  She denies any palpitations and has not had any  presyncope or syncope.  She denies any chest pain or shortness of  breath.   PAST MEDICAL HISTORY:  1. Atrial fibrillation persistent.  2. Hypertension .  3. Hyperlipidemia.   PAST SURGICAL HISTORY:  Hysterectomy.   ALLERGIES:  None.   MEDICATIONS:  1. Digoxin 125 mcg daily.  2. Metoprolol 75 mg b.i.d.  3. Clonidine 0.2 mg b.i.d.  4. Simvastatin 20 mg daily.  5. Clonazepam 0.25 mg b.i.d.  6. Hydrochlorothiazide.  7. Fentanyl.  8. Hydrocodone.  9. Prilosec.  10.Aspirin 325 mg daily.  11.Multivitamin.  12.Vitamin E.  13.Calcium.   SOCIAL HISTORY:  The patient is retired.  She is a widow.  She has 2  children.   REVIEW OF SYSTEMS:  As stated in the HPI and otherwise negative for  other systems.   PHYSICAL EXAMINATION:  GENERAL:  The patient is in no distress.  She  looks her age and is somewhat frail.  VITAL SIGNS:  Blood pressure 145/71, heart rate 69 and irregular, body  mass index 22.  HEENT:  Eyelids unremarkable.  Pupils are equal, round, and reactive to  light.  Fundi not visualized.  NECK:  No jugular venous distention at 45 degrees.  Carotid upstroke  brisk and symmetric.  No bruits.  No  thyromegaly.  LYMPHATICS:  No adenopathy.  LUNGS:  Decreased breath sounds but no wheezing or crackles.  BACK:  Kyphoscoliosis.  CHEST:  Unremarkable.  HEART:  PMI not displaced or sustained.  S1 and S2 within normal limits.  No S3, no murmurs.  ABDOMEN:  Flat.  Positive bowel sounds.  No rebound.  No guarding.  No  hepatomegaly.  No splenomegaly.  SKIN:  No rashes.  No nodules.  EXTREMITIES:  Two plus pulses.  Dep dent rubor.  NEUROLOGIC:  Grossly intact.   EKG:  Atrial fibrillation, leftward axis, poor anterior R wave  progression suggestive of an old anterior septal infarct.   ASSESSMENT/PLAN:  Atrial fibrillation.  The patient is not a Coumadin  candidate as described above.  She is maintaining the medications listed  with reasonable rate control demonstrated in the hospital and here on an  EKG.  I will get a digoxin level.  No further cardiovascular testing is  planned.   FOLLOWUP:  She can come back to this clinic as needed as long as she has  reasonable rate control.     Rollene Rotunda, MD, [facility]  Electronically Signed    JH/MedQ  DD: 01/13/2007  DT: 01/13/2007  Job #: [id number]   cc:   Willow Ora, MD

## 2010-06-23 NOTE — Assessment & Plan Note (Signed)
[facility]   NAME:  [name], [name]:  [id number]   MEDICAL RECORD NO.:  [id number]      DATE OF BIRTH:  1920-10-06   PHYSICIAN:  Maxwell Caul, M.D. VISIT DATE:  11/04/2006                                   OFFICE VISIT   [name] arrives here accompanied by her daughter for review of a wound  over her right arm.  She tells me that roughly a month ago she fell,  suffering a severe laceration and her after falling in her own home.  She suffered a large laceration to the arm.  She did not have any head  injury or loss of consciousness.  This did not appear to be a  contaminated wound.  There was no evidence of a fracture of the right  forearm her elbow.  She underwent suturing and has been followed by her  primary physician with daily cleansing and a Kerlix wrap.  They return  today to see [place] with regards to a continued small open area.   PHYSICAL EXAM:  Temperature is 98.7, pulse 60, respirations 18, blood  pressure 157/76.  The wound actually on her right forearm actually is healed quite nicely.  There were 3 remaining stitches that I removed.  Most of this wound has  healed quite nicely.  There is a small open area still between 2 areas  of this wound that have since healed.  The area had a tan colored  eschar.  This underwent a full-thickness debridement with a #15 blade,  EMLA for anesthesia.  The area is nicely granulated and I think will  heal quite nicely.   WOUND CARE PLAN AND FOLLOWUP:  We recommend daily cleansing.  They are  already doing topical antibiotic, a nonadherent dressing, and an ACE  wrap.  There is edema surrounding the wound from the previous  laceration, which I think it would justify at the Ace wrap and help with  epithelializing this wound.  Overall I think the wound care to date has  been excellent both by her primary doctors and the family, and I think  this remaining open area will close over with the  above measures.  As  mentioned, I did remove 3 remaining stitches.  There was certainly no  evidence of infection and nothing else appeared more ominous.  We will  see her again in a week.           ______________________________  Maxwell Caul, M.D.     MGR/MEDQ  D:  11/04/2006  T:  11/04/2006  Job:  [id number]   cc:   Willow Ora, MD

## 2010-06-23 NOTE — Assessment & Plan Note (Signed)
[facility]   NAME:  [name], [name]:  [id number]   MEDICAL RECORD NO.:  [id number]      DATE OF BIRTH:  03/12/1920   PHYSICIAN:  Maxwell Caul, M.D. VISIT DATE:  11/11/2006                                   OFFICE VISIT   PURPOSE TODAY'S VISIT:  Follow up of a traumatic wound over her right  arm.  I saw this a week ago.  She has suffered a large laceration to the  right arm.  This had been sutured.  When I saw this last week, it had  already been well cared for by her primary physician and family.  I  debrided the still open area of this wound, removed a few stitches, and  applied Polysporin, a nonadherent dressing, and an Ace wrap.   She returns today completely afebrile looking well.  Temperature is 93.  The area of the forearm continues to improve.  The tiny area that  remained open here is epithelializing.  It does have a small amount of  crusty covering but I did not debride this.   WOUND CARE PLAN AND FOLLOWUP:  We recommended daily cleansing, continued  Polysporin, nonadherent dressing, and Ace wrap.  I think the Ace wrap  his central the periwound edema and to protect the epithelialized areas.  I am hopeful in 2 weeks that this will be resolved.           ______________________________  Maxwell Caul, M.D.     MGR/MEDQ  D:  11/11/2006  T:  11/11/2006  Job:  [id number]

## 2010-06-23 NOTE — Consult Note (Signed)
[name], MURIN NO.:  [id number]   MEDICAL RECORD NO.:  [id number]          PATIENT TYPE:  INP   LOCATION:  2622                         FACILITY:  [facility]   PHYSICIAN:  Maree Krabbe, M.D.DATE OF BIRTH:  [birth year]   DATE OF CONSULTATION:  DATE OF DISCHARGE:                                 CONSULTATION   REASON FOR CONSULT:  Hyponatremia.   HISTORY:  This is a 87-year white female with history of hypertension  and dementia who lives in a nursing home.  She was admitted with UTI,  pyelonephritis, high fever, altered mental status, and left lower  extremity cellulitis on August 05, 2007.  She did have some renal  insufficiency which has gotten better with fluids, but she has had  persistent hyponatremia and we were asked to see the patient for this.  Her serum sodium yesterday was 162 and it is 158 today with a serum  chloride greater than 130, serum bicarbonate 20, BUN 57, and creatinine  1.2.   Patient is followed by Dr. Willow Ora.  She also has a history of PE and  atrial fibrillation.  She presented with fever and altered mental  status.   PAST MEDICAL HISTORY:  1. Hypertension.  2. Hyperlipidemia.  3. Atrial fib, not a candidate for anticoagulation.  4. GERD.  5. DJD.  6. Osteoporosis.  7. Chronic back pain.  8. History of Renal insufficiency.  9. SVT.   MEDICATIONS ON ADMISSION:  1. Aspirin.  2. Calcium.  3. Clonazepam.  4. Clonidine.  5. Hydrochlorothiazide.  6. Metoprolol 50 b.i.d.  7. Prilosec.  8. Simvastatin.  9. Digoxin.   ALLERGIES:  None.   CURRENT MEDICATIONS:  1. Catapres patch TTS 2 every 7 days.  2. Digoxin subcu.  3. Heparin.  4. Primaxin.  5. Vancomycin protocol.  6. Lopressor IV as needed.   REVIEW OF SYSTEMS:  Not available.   ALLERGIES:  None.   SOCIAL HISTORY:  The patient lives in a nursing home.  No smoking  history.   PHYSICAL EXAM:  VITALS:  Temperature is 76.9, blood pressure 160/80,  heart rate  90, and O2 sat 98% on 3 liters.  GENERAL:  The patient is stuporous.  She groans and moans in response to  verbal questions.  She does not move either of her arms.  She does not  grip and she does not follow commands.  The upper arms are basically  flaccid.  Her mouth his open.  She is tachypneic breathing and about 30  times per minute.  GENERAL:  This is a debilitated elderly white female who is stuporous.  SKIN  Diffuse thinning of the skin with ecchymosis scattered throughout.  HEENT:  PERRLA, EOMI.  Throat is dry.  NECK:  Supple with flat neck veins.  CHEST:  Clear throughout anterior and laterally.  CARDIAC:  Regular rate and rhythm without murmur, rub, or gallop.  ABDOMEN:  Soft, nontender without masses or distention.  Active bowel  sounds.  EXTREMITIES:  Trace to 1+ pitting edema in the legs.   LABS:  White blood  count 19,000, hemoglobin 12, hematocrit 36%, and  platelet 189.  Sodium 150, potassium 3.4, chloride greater than 130, C02  20, BUN 57, creatinine 1.20, calcium 8.5.  Blood cultures from August 06, 2007, are negative x2.  Urine cultures from August 06, 2007, positive for  E. Coli multi resistant.  Chest x-ray from August 10, 2007, showed a PICC  in the superior vena cava, no pneumothorax, hazy inferior left lung,  intensity not seen on previous study, but there is some rotation.  Renal  ultrasound on August 06, 2007, right kidney 10.7 cm, left kidney 11 cm, no  hydronephrosis.  There was cortical thinning.  August 07, 2007, chest x-  ray showed bibasilar atelectasis.   IMPRESSION:  1. Hyponatremia with probable hypovolemia due to excessive water loss      related to the acute febrile illness and decreased access to water,  2. Escherichia coli pyelonephritis and left leg cellulitis, on      vancomycin and Primaxin.  3. Notable for blue.  4. History of dementia.  5. History of hypertension.  6. Atrial fibrillation.   RECOMMENDATIONS:  The patient has a 5 liter water  deficit.  Will  increase D5W to 150 mL/hour and take the potassium out, which will  improve its effectiveness in reducing the hyponatremia.  We will give  her a bolus of sodium bicarb, isotonic bicarb 1 liter over the hours,  and then give her potassium replacement as runs.  We will follow.      Maree Krabbe, M.D.  Electronically Signed     Maree Krabbe, M.D.  Electronically Signed    RDS/MEDQ  D:  08/13/2007  T:  08/14/2007  Job:  [id number]

## 2010-06-23 NOTE — H&P (Signed)
NAMEPATTIJO, [name]:  [id number]   MEDICAL RECORD NO.:  [id number]          PATIENT TYPE:  INP   LOCATION:  2622                         FACILITY:  [facility]   PHYSICIAN:  Gardiner Barefoot, MD    DATE OF BIRTH:  [birth year]   DATE OF ADMISSION:  08/05/2007  DATE OF DISCHARGE:                              HISTORY & PHYSICAL   PRIMARY CARE PHYSICIAN:  Willow Ora, MD   CHIEF COMPLAINT:  Left leg swelling.   HISTORY OF PRESENT ILLNESS:  This is an 77 year old female with history  of atrial fibrillation, hypertension, and history of a PE here with  increasing left leg edema.  Per report of the son, she had a fever at  the nursing home and has not been acting her usual self.  The patient  normally was conversant and can perform activities of daily living.  This has been decreased today.  No no known sick contacts and otherwise  no significant changes under medications.   PAST MEDICAL HISTORY:  1. Hypertension.  2. Hyperlipidemia.  3. Atrial fibrillation, not to a candidate for anticoagulation.  4. GERD.  5. Osteoarthritis.  6. Osteoporosis.  7. Chronic back pain.  8. History of renal insufficiency.  9. History of SVT.  10.Mild-to-moderate mitral regurgitation.   MEDICATIONS:  1. Aspirin 325 mg p.o. daily.  2. Calcium 600 mg daily.  3. Clonazepam 0.5 mg p.o. daily.  4. Clonidine 0.2 mg t.i.d.  5. Hydrochlorothiazide 12.5 mg daily.  6. Metoprolol 50 mg p.o. b.i.d.  7. Multivitamin daily.  8. Prilosec 40 mg daily.  9. Simvastatin 20 mg at bedtime.  10.Digoxin 125 mcg daily.   ALLERGIES:  No known drug allergies.   FAMILY HISTORY:  There is a report of a history of cancer in her family,  however, it is unobtainable at this time.   SOCIAL HISTORY:  The patient does live in a nursing home, no report of  any smoking.   REVIEW OF SYSTEMS:  Negative except as per the history of present  illness.   PHYSICAL EXAMINATION:  VITAL SIGNS:  Temperature 98.4,  pulse 75-80,  respirations 20, blood pressure 85-119/54-59, O2 sats 87%-95%.  GENERAL:  The patient does open her eyes, however, minimally respond to  questions.  CARDIOVASCULAR:  Irregularly irregular with a systolic murmur.  CHEST:  With bibasilar crackles.  ABDOMEN:  Soft, nontender, nondistended.  Positive bowel sounds with no  hepatosplenomegaly.  EXTREMITIES:  With nonblanching erythema, warmth, and edema.  NEUROLOGIC:  Significant for minimal responsiveness.   LABORATORY DATA:  Sodium 140, potassium 3.7, chloride 103, bicarb 21,  BUN 51, creatinine 3.3, glucose 122.  WBC is 26 with no differential  done, hemoglobin 15, platelets 201.  UA with 21-50 WBCs and D-dimer is  8.82.  Chest x-ray is pending at this time.   IMPRESSION AND PLAN:  1. Pyelonephritis.  The patient does have positive UA report of fever      and has altered mental status which is clarified as pyelonephritis.      We will start the patient on broad-spectrum antibiotics  as she is a      nursing home resident and taper antibiotics based on culture data.      Also for low blood pressure and significant renal failure, we will      admit the patient for step-down unit for close monitoring as this      does bring up a concern for sepsis syndrome.  2. Cellulitis.  The patient's leg is consistent with cellulitis,      however, we will also rule out a deep venous thrombosis with an      ultrasound in the a.m.  3. Hypertension.  We will hold her blood pressure medicines at this      time as she is hypotensive.  4. Acute renal failure.  Her last creatinine is 1.25, today it is      significantly elevated at 3.3.  This may be secondary to      hypovolemia with a sepsis syndrome or her pyelonephritis.  We will      continue adequate hydration and monitor this closely.   DISPOSITION:  The patient is a full code.      Gardiner Barefoot, MD  Electronically Signed     RWC/MEDQ  D:  08/06/2007  T:  08/06/2007  Job:   [id number]

## 2010-06-23 NOTE — Discharge Summary (Signed)
NAMESEAIRA, BYUS NO.:  [id number]   MEDICAL RECORD NO.:  [id number]          PATIENT TYPE:  INP   LOCATION:  3742                         FACILITY:  [facility]   PHYSICIAN:  Jesse Sans. Wall, MD, FACCDATE OF BIRTH:  Feb 25, 1920   DATE OF ADMISSION:  01/03/2007  DATE OF DISCHARGE:  01/06/2007                               DISCHARGE SUMMARY   PRIMARY CARDIOLOGIST:  Rollene Rotunda, M.D., [facility]   PRIMARY CARE PHYSICIAN:  Willow Ora, M.D.   DISCHARGE DIAGNOSIS:  Atrial fibrillation with rapid ventricular  response.   SECONDARY DIAGNOSES:  1. Hypertension.  2. Gastroesophageal reflux disease.  3. History of pulmonary embolism in 2006.  4. Osteoarthritis.  5. Osteoporosis status post left hip surgery.  6. Chronic back pain status post kyphoplasty.  7. History of renal insufficiency.  8. History of supraventricular tachycardia in the setting of pulmonary      embolus.  9. History of rhabdomyolysis in the setting of dehydration, urinary      tract infection, and acute renal failure.  10.History of previous anticoagulation with difficulty maintaining      therapeutic INR.  11.Mild-to-moderate mitral regurgitation.  12.Moderate tricuspid regurgitation.   ALLERGIES:  NO KNOWN DRUG ALLERGIES.   PROCEDURES:  2-D echocardiogram on January 04, 2007 revealing an EF of  55% with no left ventricular wall motion abnormalities.  There is mild-  to-moderate mitral regurgitation, mildly reduced RV systolic function,  moderate tricuspid regurgitation.   HISTORY OF PRESENT ILLNESS:  An 77 year old Caucasian female with  history of paroxysmal atrial fibrillation recently discharged home from  [facility] on December 19, 2006.  She was seen by Dr. Drue Novel on January 03, 2007 for routine followup and was noted to be tachycardiac with  rates in the low 100s to 140s.  She also reported mild dyspnea and  dizziness.  She was sent from the office to the [facility] ED for  further  evaluation.  In the ED, her heart rate was in 120s in atrial  fibrillation.  She was in no acute distress.  She was admitted for  further evaluation and titration of medications.   HOSPITAL COURSE:  The patient was initiated on heparin.  However,  ultimately she was felt to be a poor Coumadin candidate with a history  of difficulty in maintaining a therapeutic INR in the past.  She is also  maintained on aspirin.  Her beta blocker therapy was titrated, and when  this failed to yield improved heart rates, digoxin was loaded  intravenously and then subsequently initiated at 0.125 mg p.o. daily.  With further titration of her beta-blocker to Toprol-XL 75 mg b.i.d.  along with digoxin therapy, her rates have come down into the 70s to  90s.  She has had significant improvement in symptoms and denies any  dyspnea, chest pain, or palpitations.  She has been ambulating in her  room and in the hallways and feels ready for discharge.   DISCHARGE LABS:  Hemoglobin 12.1, hematocrit 36.1, WBC 6.4, platelets  187.  INR 1.0.  Sodium 139, potassium 3.8, chloride 102,  CO2 28, BUN 10,  creatinine 1.25, glucose 107.  Total bilirubin 0.8, alkaline phosphatase  57, AST 41, ALT 22, total protein 6.5, albumin 3.6, calcium 9.1, CK 70,  MB 2.7, troponin I 0.02.   DISPOSITION:  The patient will be discharged home today in good  condition.   FOLLOWUP PLANS AND APPOINTMENTS:  Ms. Chern has followup with Dr.  Antoine Poche already scheduled for January 13, 2007 at 11:45 a.m.Marland Kitchen  She has  followup with Dr. Drue Novel as previously scheduled.   DISCHARGE MEDICATIONS:  1. Fentanyl patch 50 mcg per hour q.72h.  2. Lactulose 15 mL b.i.d.  3  Toprol-XL 75 mg b.i.d.  1. Digoxin 0.125 mg daily.  2. Calcium 600 mg daily.  3. Aspirin 325 mg daily.  4. Centrum Silver one daily.  5. Hydrocodone/APAP 5/500 mg q.4h. p.r.n. pain.  6. Vitamin E 1000 mg daily.  7. Prilosec 20 mg daily.  8. Clonazepam 0.25 mg b.i.d.  9. Clonidine 0.2 mg  b.i.d.  10.Simvastatin 20 mg q.h.s.   OUTSTANDING LAB STUDIES:  None.   DURATION OF DISCHARGE ENCOUNTER:  52 minutes, including physician time.      Nicolasa Ducking, ANP      Jesse Sans. Daleen Squibb, MD, [facility]  Electronically Signed    CB/MEDQ  D:  01/06/2007  T:  01/06/2007  Job:  [id number]   cc:   Willow Ora, MD

## 2010-06-23 NOTE — Discharge Summary (Signed)
[name], Sandy NO.:  [id number]   MEDICAL RECORD NO.:  [id number]          PATIENT TYPE:  INP   LOCATION:  3733                         FACILITY:  [facility]   PHYSICIAN:  Jesse Sans. Wall, MD, FACCDATE OF BIRTH:  May 25, 1920   DATE OF ADMISSION:  12/17/2006  DATE OF DISCHARGE:  12/19/2006                               DISCHARGE SUMMARY   PRIMARY CARDIOLOGIST:  Rollene Rotunda, MD, [facility].   PRIMARY CARE:  Willow Ora, M.D.   DISCHARGING DIAGNOSES:  1. New onset paroxysmal atrial fibrillation of questionable duration.  2. Pending decision whether or not to anticoagulate with Coumadin to      be followed up outpatient after further discussion with patient and      family.   PAST MEDICAL HISTORY:  1. Hypertension.  2. History of pulmonary embolism in 2006.  3. History of orthopedic surgeries to the left hip.  4. GERD.  5. History of chronic back pain status post kyphoplasty.  6. History of chronic kidney disease.  Baseline creatinine 1.2.  7. Osteoporosis.  8. History of SVT in the setting of pulmonary embolus.  9. History of rhabdomyolysis in the setting of dehydration and urinary      tract infection and acute renal failure.  10.Previous anticoagulation therapy in the setting of pulmonary      embolism with difficulty maintaining a therapeutic INR.   HOSPITAL COURSE:  Sandy Sandy is an 77 year old female who presented this  admission complaining of increased blood pressure and increased heart  rate in the setting of weakness and palpitations.  The patient with new  onset of paroxysmal atrial fibrillation, evaluated by cardiology fellow,  Joaquim Lai, on the day of admission.  Dr. Antoine Poche saw the patient in  follow up.  The patient was asymptomatic at that time.  Atrial  fibrillation rate stable in the 70s.  The patient's atenolol  discontinued.  Started on Toprol XL 50 with further titration up to 75  mg daily.  The patient's heart rate did increase to 140  nonsustained  when up ambulating.  Patient being discharged home to follow up in the  office for further consideration of anticoagulation therapy.  In the  meantime, will continue full strength aspirin.  Will follow up with  echocardiogram in the office also.  Dr. Daleen Squibb in to see the patient on  the day of discharge.  The patient is stable to be discharged home.  Blood pressure 128/71, heart rate 74, atrial fibrillation, satting 95%  on room air.   LABORATORY WORK THIS ADMISSION:  Cardiac markers negative.  Free T4 of  1.13.  TSH 2.438.  Potassium 4.3, BUN and creatinine 11 and 0.95.  Hemoglobin and hematocrit of 13.5 and 40.3.  At the time of discharge,  there was some confusion as to the patient's home medications.  She is  not able to name them. I have asked that she bring all her medications  to her follow-up appointment with Dr. Antoine Poche for clarification.  She  has been asked verbally and in writing to stop her atenolol.  She has a  prescription for Toprol XL.  I have given her a prescription for a 50 mg  tablet and a 25 mg tablet.  She is to take one of each every day.  Also,  aspirin enteric-coated 325 mg.  Apparently, she was on Norvasc 5 mg  daily at home and Zocor 40 mg daily.  I have asked her to continue those  medications at the dose that she is currently on at home.  I have  scheduled her follow-up appointment with Dr. Antoine Poche on January 13, 2007 at 11:45.  She is to call Dr. Drue Novel and schedule an appointment.  Follow up with him.   DURATION OF DISCHARGE ENCOUNTER:  Greater than 30 minutes.      Dorian Pod, ACNP      Jesse Sans. Daleen Squibb, MD, [facility]  Electronically Signed    MB/MEDQ  D:  12/19/2006  T:  12/19/2006  Job:  [id number]

## 2010-06-23 NOTE — Discharge Summary (Signed)
[name], [name]                ACCOUNT NO.:  [id number]   MEDICAL RECORD NO.:  [id number]          PATIENT TYPE:  INP   LOCATION:  5525                         FACILITY:  [facility]   PHYSICIAN:  Valerie A. Felicity Heath, MDDATE OF BIRTH:  01-27-1921   DATE OF ADMISSION:  08/05/2007  DATE OF DISCHARGE:  08/22/2007                               DISCHARGE SUMMARY   ADDENDUM   The patient was seen by Surgery and her right buttock wounds were  evaluated.  Upon further discussion with the son, he requested comfort  measures in regards to the patient's decubiti and declined surgical  debridement.  At this time, we plan to continue wet-to-dry dressing  changes with Santyl ointment and the patient will be transferred to  Skilled Nursing Facility for further care.  Please note that discharge  summary dictated on August 18, 2007, at 15:53, was dictated in error as  patient medical record number was entered, please disregard issues in  regards to renal function.  Ms. Lacombe renal function is stable with a  creatinine of 1.1 at the time of discharge.   Greater than 30 minutes were spent on discharge planning.       Sandy Craze, [name]      Sandy Rover. Felicity Coyer, MD  Electronically Signed    MO/MEDQ  D:  08/22/2007  T:  08/23/2007  Job:  [id number]

## 2010-06-23 NOTE — H&P (Signed)
[name], [name]:  [id number]   MEDICAL RECORD NO.:  [id number]          PATIENT TYPE:  EMS   LOCATION:  MAJO                         FACILITY:  [facility]   PHYSICIAN:  Elmore Guise., M.D.DATE OF BIRTH:  24-May-1920   DATE OF ADMISSION:  01/03/2007  DATE OF DISCHARGE:                              HISTORY & PHYSICAL   REASON FOR ADMISSION:  Recurrent atrial fibrillation with rapid  ventricular response.   PRIMARY CARE PHYSICIAN:  Sandy Ora, MD.   HISTORY OF PRESENT ILLNESS:  [name] is a very pleasant 77 year old  white female, with a past medical history of paroxysmal atrial  fibrillation, hypertension, chronic pain, gastroesophageal reflux  disease, osteoporosis, history of PE (yrs ago with difficulty  maintaining a therapeutic INR)who presents for evaluation of recurrence  of her rapid atrial fibrillation.  The patient recently was hospitalized  November 8-10, 2008 for treatment of rapid atrial fibrillation.  She was  changed from Atenolol and placed on metoprolol, with good rate control.  After discharge from the hospital, she did well.  She went for a routine  office visit today, and on arrival there she had heart rates ranging  from 100-140.  Typically while up and active, her heart rate would go up  to 140s; otherwise it would stay near the 100 range.  She reports mild  shortness of breath and some dizziness.  She was then sent for direct  admission for further treatment.   Of note, during her last hospitalization a full workup included blood  work, which was negative for myocardial infarction; a normal thyroid,  with TSH of 2.4, and a normal T4 at 1.1.  Her hemoglobin was 13.5 and  her renal function was stable, with a BUN and creatinine of 11 and 0.9.   On discharge, it was recommended that after good outpatient evaluation,  she may need restarting of her Coumadin; however, this was held off at  current time.   She denies any bleeding nor  easy bruising.  She does report chronic  constipation and takes laxatives to help her with this.  She has mild  chronic abdominal distention, as well as mildly decreased appetite.  Otherwise, she denies any fever, cough, orthopnea, PND, palpitations,  lower extremity edema, etc.  All other review of systems are negative.   CURRENT MEDICATIONS:  1. Hydrochlorothiazide 25 mg 1/2 tablet daily.  2. Clonidine 0.2 mg one tablet twice daily.  3. Prilosec 20 mg one tablet daily.  4. Lactulose 10 g/15 mL; 30 mL twice daily.  5. Klonopin 0.5 mg 1/2 tablet twice daily.  6. Simvastatin 20 mg one tablet daily.  7. Vicodin 5/500 mg one p.o. q.4 h. p.r.n.  8. Duragesic patch 50 mcg/hr, apply every 72 hours.  9. Metoprolol 50 mg in the a.m. and 25 mg in the p.m.  10.Calcium, vitamin E and multivitamin daily.  11.Aspirin 325 mg daily.   ALLERGIES:  NONE.   FAMILY HISTORY:  Positive for cancer.  No history of early heart  disease.   SOCIAL HISTORY:  The patient lives independently.  She is ambulatory  with a walker.  No history of tobacco or significant alcohol use.  She  is widowed with 2 children.   PHYSICAL EXAMINATION:  She is afebrile, blood pressure 116/70, heart  rate in 120s (showing atrial fibrillation on the monitor), saturations  95% on room air.  GENERAL:  She is a very pleasant elderly white female, alert and  oriented x4.  No acute distress.  HEENT:  Appeared normal.  NECK:  Supple, no lymphadenopathy.  Two-plus carotids.  No JVD and no  bruits.  LUNGS:  Clear, with good breath sounds at the bases.  HEART:  Irregularly irregular, with no rub noted.  ABDOMEN:  Soft, distended and tympanitic; but no rebound or guarding.  EXTREMITIES:  Warm, with 2+ pulses and no significant edema.  Her right  forearm has a healed incision from prior surgery, with no significant  erythema or drainage noted.   LABORATORY STUDIES:  Cardiac enzymes negative, BUN/Cr 12/1.2, K 4.1, AST  41 (otherwise  normal LFT's), Normal COAGS, WBC 7.4, Hb 12.5, Plts 204.  CXR- No active disease.  Her ECG shows atrial fibrillation with rapid  ventricular response; 119/minute with inferior q-waves and poor R-wave  progression.  She has not had an echocardiogram   IMPRESSION:  1. Atrial fibrillation with rapid ventricular response.  2. History of hypertension.  3. Gastroesophageal reflux disease.  4. Chronic pain (lower back).  5. History of pulmonary embolus (years ago); difficulty with      maintaining therapeutic INR (per notes).   PLAN:  At this time the patient seems to be doing very well.  We will  continue aspirin.  Will hold anticoagulation for primary team.  I will  continue her medications as listed at home, except change her metoprolol  to short-acting 25 mg q.6 h. (which will give her a total dose of 100  mg/day, slightly up from before).  I do recommend an echocardiogram to  evaluate left ventricular function.  Pt will have Cardiology PRN orders.  Further care per primary team   All of her questions were answered, and plan was discussed with her  family.      Elmore Guise., M.D.  Electronically Signed     TWK/MEDQ  D:  01/03/2007  T:  01/03/2007  Job:  [id number]   cc:   Sandy Ora, MD

## 2010-06-23 NOTE — H&P (Signed)
[name], RITACCO NO.:  [id number]   MEDICAL RECORD NO.:  [id number]          PATIENT TYPE:  EMS   LOCATION:  MAJO                         FACILITY:  [facility]   PHYSICIAN:  Sandy D. Prime, MD    DATE OF BIRTH:  1920/02/28   DATE OF ADMISSION:  06/15/2006  DATE OF DISCHARGE:                              HISTORY & PHYSICAL   The patient is full code.   PRIMARY CARE PHYSICIAN:  Sandy Ora, MD   TOTAL VISIT TIME:  Approximately 54 minutes.   The patient was the primary historian.  She was a good historian.   CHIEF COMPLAINT:  Weakness.   HISTORY OF PRESENT ILLNESS:  [name] is an 77 year old female with  history of pulmonary embolism, requiring Coumadin therapy; now off  Coumadin therapy.  History of urinary tract infection.  History of left  transcervical hip fracture requiring hemiarthroplasty in December of  2006, who presents with feeling weak for the last 48 hours.  The patient  notes weakness beginning 2 days prior to admission and weakness  primarily in the lower extremities, and she notes that she has fallen in  the past and was very afraid to walk because of this.  She has not  recently fallen.  The patient's son was visiting her and noticed  possible confusion.  Per emergency room physician, the son has since  left.  She denies any fever, cough, chest pain, or dysuria.  The patient  notes mild dyspnea on exertion for years; this has not worsened.  She  denies any palpitations.  She denies any lower extremity edema or  swelling, and she denies any redness.  She denies any pain anywhere,  particularly in the abdomen.  She denies any headache.  She notes  chronic back pain, which has not worsened.  In the emergency room, the  patient was given Rocephin and IV fluids, and radiology was consulted  for lumbar puncture later in the day.   PAST MEDICAL HISTORY AND PAST SURGICAL HISTORY:  1. History of hypertension.  2. History of urinary tract  infection.  3. History of pulmonary embolism in March of 2006.  4. History of left transcervical hip fracture status post      hemiarthroplasty in December of 2006.  5. History of gastroesophageal reflux disease.  6. History of chronic low back pain.  She is status post kyphoplasty      of L1 for compression fracture.  7. History of chronic kidney disease with creatinine, in January of      2008, with a baseline of 1.2.  8. History of osteoporosis.  9. She is status post hysterectomy.  10.She has a history of left ankle contusion.  11.She has a history of SVT, when she presented with the pulmonary      embolus.  12.History of rhabdomyolysis.  13.History of constipation.   ALLERGIES:  No known drug allergies.   MEDICATIONS:  She is on:  1. Atenolol; she is unsure of the dose.  2. Clonazepam, unsure of the dose.  3. Fentanyl, unsure of the dose.  4.  Simvastatin, unsure of the dose.  5. Tramadol, unsure of the dose.  6. Hydrochlorothiazide, unsure of the dose.   FAMILY HISTORY:  Positive for pancreatic cancer.   SOCIAL HISTORY:  She lives alone in [place].  No history of tobacco,  alcohol, or drug use.  She is being visited by her son.   REVIEW OF SYSTEMS:  Ten-point review of systems negative, unless stated  above.   PHYSICAL EXAMINATION:  VITAL SIGNS:  Temperature max in the emergency  room was 102.4 with a respiratory rate of 14, blood pressure 122/55,  pulse 71, and respiratory rate increased to 18-20 when she was phased  off oxygen.  Her SATs are 100% on 3 liters nasal cannula and dipped into  the 89% on room air.  GENERAL:  She is a female that looks her stated age, lying upright in  bed in no acute distress, drowsy, as she notes she has lost sleep.  HEENT:  Concerning her eyes, her pupils are equal, round, and reactive  to light.  Her conjunctivae not pale.  The sclera is anicteric.  The  oropharynx is dry.  She has dentures.  NECK:  Supple with no lymphadenopathy,  thyromegaly.  No carotid bruits  are noted.  No jugular venous distention.  CARDIOVASCULAR EXAM:  On auscultation, has regular rhythm and rate, with  no murmurs, rubs, or gallops.  Normal S1, S2.  No S3 or S4 noted.  No  elevated P2.  ABDOMEN:  Soft, nontender but is significantly distended with no  hepatosplenomegaly.  LUNGS:  Shows bilateral crackles at the bases, otherwise clear.  EXTREMITIES:  Shows no clubbing, cyanosis, or edema noted.  Her pulses  are 1+, symmetric bilaterally on the dorsalis pedis.  NEUROLOGIC:  At the time of her exam here, she is alert and oriented x4.  Cranial nerves II-XII grossly intact.  Strength and sensation which are  grossly intact.  She was very hesitant to ambulate, so that portion of  the exam was not performed.   LABORATORY DATA:  Urinalysis was negative.  Urine culture thus far  negative.  She had a sodium done from an i-STAT that was 128 with a  potassium of 4.0, chloride 100, and BUN of 18.  Her glucose was 138,  hemoglobin 14, white count was 15.5 with a hemoglobin of 15, hematocrit  of 38.5, platelets 252, segs of 86.  Total protein 7.0, albumin 3.4, AST  and ALT were normal.  CT of the head is pending.  Her chest x-ray showed  bilateral atelectasis without hiatal hernia noted.  She has diffuse  peribronchial thickening and osteopenia.   ASSESSMENT AND PLAN:  This is a patient who presents with a fever and  apparently had some confusion and also with a history of pulmonary  embolus in the past, and the concern here is for a possible pulmonary  embolus given her hypoxia and her history of it and also meningitis  given her confusion and fever.  To rule out pulmonary embolus, it is  suggested that she be on a heparin drip after lumbar puncture is  performed.  We will get a D-dimer and an ultrasound of the lower  extremities to rule out deep venous thrombosis.  To rule out meningitis, radiology has performed, today, a lumbar puncture.  She has a  history of  significant lumbar compression fractures and has had  surgery there in the past.  We will get a CT of the head prior.  She  will be on vancomycin and Rocephin, and in the meantime, we will get  blood cultures x2.  For her acute renal failure and chronic kidney  disease, we will gently hydrate.  We will also get an EKG to rule out  pericarditis.      Sandy D. Prime, MD  Electronically Signed     DDP/MEDQ  D:  06/15/2006  T:  06/15/2006  Job:  [id number]

## 2010-06-23 NOTE — Discharge Summary (Signed)
NAMEJOVIE, [name]:  [id number]   MEDICAL RECORD NO.:  [id number]          PATIENT TYPE:  INP   LOCATION:  6529                         FACILITY:  [facility]   PHYSICIAN:  Barbette Hair. Artist Pais, DO      DATE OF BIRTH:  1920/07/25   DATE OF ADMISSION:  06/15/2006  DATE OF DISCHARGE:  06/17/2006                               DISCHARGE SUMMARY   DISCHARGE DIAGNOSIS:  1. Fever with weakness and hypoxia with low probability VQ scan.  2. Hyponatremia on HCTZ, now discontinued.  3. Acute on chronic renal insufficiency.  4. Hypertension   HISTORY OF PRESENT ILLNESS:  [name] is an 77 year old white female who  was admitted on Jun 15, 2006.  Her chief complaint at time of admission  was weakness.  She was noted to have a fever in the emergency department  of 102.4, and was admitted for further evaluation and treatment.   PAST MEDICAL HISTORY:  1. Hypertension.  2. UTI.  3. PE, [id number].  4. Left transcervical hip fracture, status post hemiarthroplasty,      December2006.  5. GERD.  6. Chronic low back pain, status post kyphoplasty of L1 for      compression fracture.  7. History of chronic kidney disease with a baseline creatinine,      January2008, of 1.2.  8. History of osteoporosis.  9. Status post hysterectomy.  10.History of left ankle contusion.  11.History of SVT at time of pulmonary embolus, [id number].  12.History of rhabdomyolysis.  13.History of constipation.   COURSE OF HOSPITALIZATION:  1. Fever with weakness and hypoxia.  The patient was admitted and no      further fevers were documented after the emergency department.  She      was treated empirically with IV vancomycin and IV Rocephin.  A      chest x-ray was performed on admission which showed cardiomegaly      and mild pulmonary venous congestion.  A CT head was performed on      admission as the patient had reportedly been somewhat confused      which showed no acute abnormality.  She was noted  to have an      elevated D-dimer with a value of 3.11, prompting a VQ scan.  V/Q      scan performed on Jun 16, 2006, noted low probability for PE.  Urine      culture was sent which was negative and blood cultures x2 remained      no growth to date at time of this dictation.  She was noted to have      a mild leukocytosis on admission with a white blood cell count of      15,000.  Today, white blood cell count has normalized with a value      of 8.8.  We will continue antibiotics and change to oral Avelox for      presumed pulmonary infection, and we will treat empirically for 7      days.  2. Hyponatremia.  The patient was noted to have  hyponatremia on      admission with a sodium of 128.  This resolved with the      discontinuation of HCTZ and gentle IV hydration with normal saline.      Sodium at time of discharge is 142.  3. Acute on chronic renal insufficiency.  She was noted to have mild      renal insufficiency on admission 1.5, creatinine at time of      discharge is 1.02 after IV hydration, suspect dehydration at time      of admission.  4. Hypertension.  The patient was continued off of      hydrochlorothiazide, however, she was noted to have an elevated      blood pressure with systolic pressure ranging from 152-172.  At      time of discharge, Norvasc 5 mg p.o. daily is being initiated.  She      will need followup BP check at her post hospital follow-up visit.   The patient was seen by physical therapy who is recommending home health  physical therapy as well as occupational therapy who is recommending  home health.   DISCHARGE MEDICATIONS:  1. Tenormin 25 mg p.o. daily.  2. Zocor 40 mg p.o. daily.  3. Norvasc 5 mg p.o. daily.  4. Avelox 400 mg p.o. daily for 7 days.  5. Clonazepam.  The patient is instructed to continue at same dose as      before.  6. Tramadol.  The patient is instructed to continue at same dose as      before.   DISPOSITION:  The patient be  discharged home with home health PT and OT.   FOLLOW UP:  The patient is scheduled to follow up with Dr. Willow Ora on  Wednesday, May14 at 10:45 a.m.  She is instructed to call Dr. Drue Novel or go  to the emergency department if she develops worsening weakness.      Sandford Craze, NP      Barbette Hair. Artist Pais, DO  Electronically Signed    MO/MEDQ  D:  06/17/2006  T:  06/17/2006  Job:  [id number]   cc:   Willow Ora, MD

## 2010-06-23 NOTE — H&P (Signed)
[name], [name]:  [id number]   MEDICAL RECORD NO.:  [id number]           PATIENT TYPE:   LOCATION:                                 FACILITY:   PHYSICIAN:  Rod Holler, MD     DATE OF BIRTH:  May 23, 1920   DATE OF ADMISSION:  DATE OF DISCHARGE:                              HISTORY & PHYSICAL   CHIEF COMPLAINT:  Elevated heart rate.   HISTORY:  [name] is a very pleasant 77 year old female with history of  hypertension, history of PE in the past, who presented to the emergency  department because she checked her heart rate and blood pressure on her  [place] home and both of these were elevated.  She has had some mild  weakness recently.  She has had no complaints of palpitations, no chest  pain, no shortness of breath, no syncope or presyncope, no lower  extremity edema, no PND or orthopnea.  In the emergency department, the  patient was found to be in atrial fibrillation.  This is a new diagnosis  for her.  She is unable to tell how long she has been in this abnormal  heart rhythm.   PAST MEDICAL HISTORY:  1. Hypertension.  2. History of UTI.  3. History of PE.  4. History of head hip fracture.  5. GERD.  6. Low back pain.  7. Chronic renal insufficiency.  8. Osteoporosis.  9. Status post hysterectomy.  10.History of SVT in the setting of her PE.  11.History of rhabdomyolysis.   MEDICATIONS:  1. Atenolol 25 mg p.o. daily.  2. Zocor 40 mg p.o. daily.  3. Norvasc 5 mg p.o. daily.  4. Fentanyl patch 50 mcg q.3 days.   ALLERGIES:  NO KNOWN DRUG ALLERGIES.   SOCIAL HISTORY:  The patient is a nonsmoker.   FAMILY HISTORY:  Noncontributory.   REVIEW OF SYSTEMS:  All systems are reviewed in detail, are negative  except as noted in history of present illness.   PHYSICAL EXAMINATION:  Blood pressure 148s/80s, heart rate 70s to 90s,  temperature 97.6, oxygen saturation 95% on room air.  GENERAL:  Well-developed elderly female, alert and  oriented x3.  No  apparent distress.  HEENT: Atraumatic, normocephalic.  Pupils equal, round, react to light.  Extraocular movements intact.  Oropharynx clear.  NECK:  Supple.  No adenopathy, no JVD, no carotid bruits.  CHEST:  Lungs are clear to auscultation bilaterally with equal bilateral  breath sounds.  CORONARY:  Irregularly irregular, 1/6 systolic ejection murmur, 1+  peripheral pulses.  ABDOMEN:  Soft, nontender, nondistended.  Active bowel sounds.  EXTREMITIES:  No clubbing, cyanosis or edema.  Scar on her right upper  extremity.  NEUROLOGIC:  No focal deficits.  EKG shows atrial fibrillation, old  anterior lateral infarct, left axis deviation, nonspecific ST changes,  poor quality EKG.   LABORATORY DATA:  White blood cell count 6.9, hematocrit 40.3, platelet  count 251, INR 1, CK-MB of 1.7, troponin less than 0.05, myoglobin 33,  magnesium 2.1.  Sodium 133, potassium 4.3, chloride 101, bicarbonate 24,  BUN 11, creatinine 1, glucose 101.   IMPRESSION AND PLAN:  77 year old female with new-onset atrial  fibrillation, unknown duration, asymptomatic.  History of fall requiring  greater than 50 stitches about 3 months ago.   PLAN:  1. Cardiovascular:  Admit the patient to a telemetry bed.  Aspirin      daily, Zocor daily, Lopressor 12.5 mg p.o. q.6 hours, home dose of      Norvasc.  Check thyroid function tests, echocardiogram.  Hold      anticoagulation for now given the fact that I am unsure if she is a      good long-term candidate given her fall about 3 months ago.  Rule      out with serial cardiac enzymes.  2. Endocrine.  Check thyroid function tests.  3. Fluids, electrolytes and nutrition.  Place the patient on a cardiac      diet.  Magnesium is greater than 2, potassium is 4-5.      Rod Holler, MD  Electronically Signed     TRK/MEDQ  D:  12/17/2006  T:  12/19/2006  Job:  [id number]

## 2010-06-26 NOTE — Discharge Summary (Signed)
[name], [name]:  [id number]   MEDICAL RECORD NO.:  [id number]                   PATIENT TYPE:  INP   LOCATION:  0345                                 [facility]ACILITY[facility]   PHYSICIAN:  Stacie Glaze, M.D. [facility]           DATE OF BIRTH:  Jun 24, 1920   DATE OF ADMISSION:  10/23/2003  DATE OF DISCHARGE:  10/26/2003                                 DISCHARGE SUMMARY   ADMISSION DIAGNOSES:  1.  Dehydration.  2.  Urinary tract infection.  3.  Elevated creatinine kinase.   DISCHARGE DIAGNOSES:  1.  Dehydration resolved.  2.  Status post falls secondary to dehydration.  Negative head computed      tomogram.  3.  Urinary tract infection.  4.  Rhabdomyolysis.  5.  Acute renal failure on chronic renal insufficiency resolved.   HOSPITAL COURSE:  The patient is an 77 year old white female who was  admitted by Dr. Drue Novel for a fall yesterday and was found on the bathroom floor  of her apartment.  She is admitted for observation, rehydration, treatment  of her urinary tract infection, and investigation of the etiology of her  probable syncopal event.  In the hospital she was found to be markedly  dehydrated with acute rhabdomyolysis and a marked increased creatinine to  2.6 from a baseline of 1.4.  She was started on IV fluids at 75 cc/hour.  She was continued on her Bactrim and was observed.  Her creatinine dropped  to 2.2.  However, the CK remained elevated at 2662.  IV fluids were  increased to 125 cc/hour for 32 hours with a marked improvement in her  creatinine to 1.8 and CK decreased to 1000.  She was considered stable for  discharge taking oral well, able to push fluids, ambulating well, and  requesting discharge to home.  She will be discharged to home.   DISCHARGE MEDICATIONS:  Continue Septra DS as per the admission until she  completes her dose.   DISCHARGE INSTRUCTIONS:  She is to push fluids.   FOLLOWUP:  She is to present for evaluation by  Dr. Drue Novel within one week for  assessment of her creatinine, fluid balance, and resolution of urinary tract  infection.      JEJ/MEDQ  D:  10/26/2003  T:  10/27/2003  Job:  [id number]   cc:   Wanda Plump, MD [facility]  [phone] [place], [place] [address]

## 2010-06-26 NOTE — Discharge Summary (Signed)
[name], COMMON NO.:  [id number]   MEDICAL RECORD NO.:  [id number]          PATIENT TYPE:  INP   LOCATION:  5019                         FACILITY:  [facility]   PHYSICIAN:  Kerrin Champagne, M.D.   DATE OF BIRTH:  [birth year]   DATE OF ADMISSION:  01/21/2005  DATE OF DISCHARGE:  01/29/2005                                 DISCHARGE SUMMARY   ADMISSION DIAGNOSES:  1.  Left transcervical hip fracture displaced.  2.  History of pulmonary embolism March of 2006 requiring chronic Coumadin      usage.  3.  Supratherapeutic anticoagulation on admission with value of 3.6.  4.  Hypertension.  5.  Gastroesophageal reflux disease.  6.  Chronic low back pain status post kyphoplasty at the L1 level for      compression fracture.  Patient on chronic pain medication including      fentanyl patch.  7.  Chronic renal insufficiency.  8.  Osteoporosis.  No current prescriptive medications utilized.  9.  Remote history of hysterectomy.  10. Left ankle contusion on admission with fracture ruled out by x-ray.  11. Laceration dorsal left hand sutured in the emergency room.   DISCHARGE DIAGNOSES:  1.  Left transcervical hip fracture displaced.  2.  History of pulmonary embolism March of 2006 requiring chronic Coumadin      usage.  3.  Supratherapeutic anticoagulation on admission with value of 3.6.  4.  Hypertension.  5.  Gastroesophageal reflux disease.  6.  Chronic low back pain status post kyphoplasty at the L1 level for      compression fracture.  Patient on chronic pain medication including      fentanyl patch.  7.  Chronic renal insufficiency.  8.  Osteoporosis.  No current prescriptive medications utilized.  9.  Remote history of hysterectomy.  10. Left ankle contusion on admission with fracture ruled out by x-ray.  11. Laceration dorsal left hand sutured in the emergency room.  12. Hyponatremia, resolved at discharge.  13. Atelectasis treated with incentive spirometry.  14. Urinary retention requiring Foley catheterization with plan for 48 hours      of catheterization at nursing home followed by voiding trial and empiric      Keflex treatment.  Urinalysis pending at the time of this discharge.  15. Low grade fever probably secondary to atelectasis.  16. Post hemorrhagic anemia without need for blood transfusion, stable at      discharge.   PROCEDURE:  On January 21, 2005 patient underwent left cemented bipolar hip  hemiarthroplasty performed by Dr. Otelia Sergeant assisted by Maud Deed, P.A.-C.  under general anesthesia.   CONSULTS:  1.  Dr. Felicity Coyer of [facility]  2.  Dr. Riley Kill of physical medicine and rehabilitation   BRIEF HISTORY:  Patient is an 77 year old white female who resides alone and  was walking to answer her telephone in the early morning and fell landing on  her left hip.  Patient has Life Line which alerted EMS.  She was brought to  [facility] Emergency Room with complaints of pain in the  left hip.  X-rays  were performed and showed displaced femoral neck fracture of the left hip.  She also had a superficial laceration to the left hand which was bleeding.  In the emergency room this was treated with local care.  Procedure including  infiltration of local anesthetic and use of sutures and Steri-Strips  repaired this.  Penrose drain was placed at that time and bulky dressing  applied.  She also had swelling and bruising of the left ankle.  She was  moving the ankle well.  X-rays were obtained and showed no fracture of the  left ankle.  This was treated with use of an ACE wrap for compression and  elevation and ice.  It was noted on admission that her INR was 3.6 as she  was on chronic Coumadin for history of pulmonary embolism.  Patient was  supratherapeutic and therefore was admitted to the hospital for reversal of  her anticoagulation.  She was seen by Dr. Felicity Coyer on admission and  treatment was provided for her  supratherapeutic values.   BRIEF HOSPITAL COURSE:  Upon admission patient was taken from the emergency  room to an orthopedic bed.  There she was given subcutaneous vitamin K and  allowed to drift downward with her INR slowly to prevent recurrence of her  pulmonary embolism.  She was at bed rest during this time and  anticoagulation laboratories were checked frequently.  She was treated with  Buck's traction to the left lower extremity which kept her comfortable.  Her  fentanyl patch was continued which was her medication prior to admission and  she was also given supplemental oral analgesics to assist with her pain.  Her left hand remained elevated.  The Penrose drain was removed in  approximately 24 hours and no further bleeding was noted.  The wound  continued to show a large amount of ecchymosis but the swelling was  decreased and continued to heal well throughout the hospital stay with local  dressings.  Her right ankle was treated with ice and elevation and continued  to show decreased edema and less pain during the hospital stay as well.  Eventually, her INR was corrected and she was able to be taken to the  operating room on January 25, 2005 where she underwent the above-stated  procedure.  Immediately following her surgery she was placed on Lovenox and  Coumadin for continuation of anticoagulation to prevent pulmonary embolus or  DVT.  Dr. Felicity Coyer and her team continued to follow the patient through much  of the remaining hospital stay and she was stable hemodynamically with  hemoglobin and hematocrit remaining above 9 and above 26.  She did not  require a blood transfusion during the hospital stay.  All of her other  problems including her gastroesophageal reflux disease and chronic renal  insufficiency remained stable throughout the hospital stay.  Coumadin and  Lovenox were dosed by the pharmacy.  A rehabilitation consult was obtained. Unfortunately, due to the fact that  patient needed to be independent at  discharge as she did live alone it was felt she may need longer inpatient  rehabilitation than could be provided at subacute care unit or at  rehabilitation and therefore it was felt nursing home placement was her best  option.  In the latter part of her hospital stay she did develop  hyponatremia and had some volume increase.  Hydrochlorothiazide was utilized  and she did diurese with bringing her sodium value back to 134 before  discharge.  Her lowest value was 130.  Patient had low-grade temperatures as  high as 101 in the early part of her hospital stay, but trending downward to  as low as 99 towards the latter part of her hospital stay.  Chest x-ray  prior to discharge did show bibasilar atelectasis which was treated with  incentive spirometry and encouragement of coughing and deep breathing.  BNP  was checked to make sure she did not have any signs of congestive heart  failure and was found to be 148.4 which was only mildly elevated.  Her renal  function values remained stable throughout the hospital stay.  Patient's IV  fluids were discontinued and patient was able to eat without difficulty.  Her Foley catheter was discontinued and initially was voiding well, but  began developing urinary retention on January 29, 2005 requiring I&O  catheterization x2.  Initially 425 mL was removed and at a later time 600 mL  removed.  Patient's Foley catheter was replaced on January 29, 2005 with  urinalysis sent to rule out urinary tract infection.  Keflex was started  empirically.  It was felt that she could continue with the Foley catheter  for an additional 48 hours and then a voiding trial started.  As final  values from the urinalysis are obtained and the catheter removed the Keflex  will be discontinued.  Keflex was chosen as sulfa drugs as well as Cipro do  affect the values of her Coumadin.  Dressing changes were done daily of the  left hip as well as  the left hand.  All throughout the hospital stay there  was no drainage from her wounds and they did show good healing.  Patient did  continue to have large amounts of ecchymosis around these areas primarily  because of her supratherapeutic INR on admission.  From a physical therapy  standpoint she received therapy daily after her surgery.  Initially she did  require +2 total assistance for bed to chair transfers.  Fortunately, by  January 28, 2005 she was able to walk 5 feet with a rolling walker  providing 80% participation.  Patient was doing much better at that time.  Occupational therapy assisted her with ADLs.  She will continue to need both  of these at the nursing facility.  She is allowed weightbearing as tolerated  on the operative extremity as well as the left hand as there is no fracture  in the left hand.  She does require walker for ambulation.  Patient was felt  stable for discharge on January 29, 2005 and a bed was available at [facility].  She will be transferred there for continuation of  her care.   LABORATORIES:  Admission chemistry studies included a CMET showing  hyponatremia 133, glucose 114 with remaining values all within normal  limits.  Patient's sodium fluctuated to the lowest value of 130 and at  discharge was at 134 with the remaining BMET within normal limits.  BNP on  December 21 was 148.4.  Urinalysis on admission was negative for urinary  tract infection and at the time of this discharge there is a urinalysis  pending.  CBC on admission with WBC 10.4, hemoglobin 14.4, hematocrit 41.5.  At the time of discharge last WBC on December 21 was 9.2, on December 22 H&H  was 9.1 and 26.6.  On admission INR was 3.6, on discharge INR is 1.6 and  patient continues to be on Coumadin and Lovenox.  Chest  x-ray on December 21  showed bibasilar atelectasis.  EKG on admission showed normal sinus rhythm  with first degree AV block, anterior infarct age  undetermined, no  significant change since last tracing confirmed by Dr. Willa Rough.   PLAN:  Patient is being transferred to [facility].  There  she should continue to receive physical therapy for ambulation and gait  training as well as strengthening of the lower extremities on a daily basis.  She must be out of bed at least twice daily and be ambulated at least once  daily by a physical therapist.  Occupational therapy should continue to  assist with ADLs as patient does live independently prior to discharge and  requires to be discharged back to her home ultimately.  She is weightbearing  as tolerated on the left lower extremity and on the left upper extremity.  Dressing change to the left hip wound should be done daily.  Light dressing  to the left hand daily.  We will see her back two weeks from the date of her  surgery to address wounds further.  Patient should continue on a regular  diet.  At the time of discharge she has a Foley catheter.  After 48 hours  catheter should be removed with voiding trial performed.  Patient will  continue on Coumadin for DVT and PE prophylaxis.  She should remain on  Lovenox until her INR is therapeutic.  At that time Lovenox may be  discontinued and Coumadin used as a single agent.   DISCHARGE MEDICATIONS:  1.  Atenolol 50 mg p.o. daily.  2.  Catapres 0.1 mg p.o. b.i.d., hold for systolic blood pressure less than      110.  3.  Hydrochlorothiazide 12.5 mg daily.  4.  Duragesic patch 25 mg q.72h.  5.  Protonix 40 mg p.o. daily.  6.  Lovenox 30 mg subcutaneous q.12h. until patient has a therapeutic INR at      2.  7.  Ferrous sulfate 325 mg p.o. t.i.d.  8.  Chronulac 30 mg p.o. t.i.d.  9.  Percocet 5/325 one to two every four to six hours as needed for pain.  10. Darvocet-N 100 one every four to six hours as needed for pain.  11. Tylenol 325 mg one to two every four to six hours as needed for pain.  12. Robaxin 500 mg one  every eight hours as needed for spasm. 13. Coumadin should be dosed according to pro times at the nursing facility      by their pharmacist or the physician covering the patient.   Patient should be continued to be encouraged in incentive spirometry as she  does have atelectasis on her chest x-ray.  She will need to be monitored  closely to avoid any pneumonia.  Also helpful would be getting her out of  bed daily to avoid pneumonia and also encouraging her to move as much as  possible.  She should undergo strict skin care keeping her heels off of her  bed as she is unable to lift the left leg at this time secondary to her  surgery.  She should be watched closely for sacral breakdown as well as heel  breakdown and elbow breakdown.  Any questions regarding her orthopedic care  should be addressed by calling Dr. Barbaraann Faster office at [phone].  She should  be seen in his office two weeks from the date of her surgery and an  appointment should be made by  calling that number as well.  From a medical  standpoint her primary care physician is at [facility].  She should  follow up with them on a regular basis as scheduled prior to admission.   CONDITION ON DISCHARGE:  Stable.      Wende Neighbors, P.A.      Kerrin Champagne, M.D.  Electronically Signed    SMV/MEDQ  D:  01/29/2005  T:  01/29/2005  Job:  [id number]

## 2010-06-26 NOTE — Op Note (Signed)
NAMESHERMAN, LIPUMA NO.:  [id number]   MEDICAL RECORD NO.:  [id number]          PATIENT TYPE:  INP   LOCATION:  5501                         FACILITY:  [facility]   PHYSICIAN:  Kerrin Champagne, M.D.   DATE OF BIRTH:  1920-07-19   DATE OF PROCEDURE:  01/21/2005  DATE OF DISCHARGE:                                 OPERATIVE REPORT   PREOPERATIVE DIAGNOSIS:  Left transcervical hip fracture, displaced.   POSTOPERATIVE DIAGNOSIS:  Left transcervical hip fracture, displaced.   PROCEDURE:  Left DePuy cemented bipolar hip hemiarthroplasty using a Summit  number 6 cement femoral stem with a number 3 cement restricter, a 47 mm  outside diameter shell with a 28 mm by 8.5 mm head and neck implant.   SURGEON:  Kerrin Champagne, M.D.   ASSISTANT:  Wende Neighbors, P.A.-C.   ANESTHESIA:  General via orotracheal intubation, Dr. Noreene Larsson.   ESTIMATED BLOOD LOSS:  200 mL.   PATHOLOGY:  Not applicable.   SPECIMENS:  None.   COMPLICATIONS:  None.   DISPOSITION:  The patient returned to the PACU in good condition.   HISTORY OF PRESENT ILLNESS:  The patient is an 77 year old female who fell  on Thursday, four days ago, sustaining a left transcervical hip fracture.  She has a history of previous saddle pulmonary embolus a year ago and has  been on Coumadin maintenance with an initial INR of 3.  She was admitted,  underwent gradual, progressive, normalization of her INR, and is brought to  the operating room today to undergo a bipolar hip hemiarthroplasty for a  left transcervical neck fracture.   OPERATIVE FINDINGS:  As above.   DESCRIPTION OF PROCEDURE:  After adequate general anesthesia, the patient in  a right lateral decubitus position, left side up, the Montreal frame used to  maintain position, a chest wall, all pressure points were well padded  especially on the down leg.  Standard prep with DuraPrep solution from the  left ankle to the left lower rib margin and  circumferentially about the leg  and thigh and buttock area.  Draped in the usual manner, Ioban Vi Drape  used.  Standard preoperative antibiotics.  The incision, standard Southern  approach to the left hip, approximately 5 3/4 inch in length through the  skin and subcutaneous layers directly over the greater trochanter, in line  with the tensor fascia proximally and in line with the tensor fascia and  muscle distally, through the skin and subcutaneous layers directly down to  the tensor fascia lata.  This was incised over the greater trochanter,  extended distally and then proximally into the tensor muscle.  This was then  spread, a Charnley retractor inserted.  The greater trochanteric debrided at  its bursa, hip internally rotated, and the piriformis tendon identified.  This was then tagged and divided off the greater trochanter exposing the  posterior hip capsule.  A Cobb then used to elevate the short external  rotators off the posterior hip capsule, these were then tagged and divided  off the posterior proximal aspect of the femur  exposing the entire posterior  hip capsule.  T-incision made into the hip capsule with electrocautery.  The  edges of the hip capsule tagged with number 1 Ethibond sutures.  The hip was  then internally rotated and flexed, a Colver was placed about the femoral  neck and the neck was trimmed at the level of the cut 1 fingerbreadth above  the lesser trochanter using an oscillating saw, protecting the soft tissue  structures with the Colver provided.  Once this was removed,  then the bone  fragments that were cut were then removed using a Leksell rongeur.  A  corkscrew osteotome was then used to remove the femoral head from its  acetabulum.  This was done without difficulty.  The head measured about 48  mm, trial with a 47 mm implant, then a 48, 47 was chosen as it provided  excellent better fit.  Irrigation performed of the acetabulum, all bony  debris was  removed from the acetabulum, ligament of teres debrided to its  stop and then cauterized over this area of small bleeding blood vessel.  A  sponge was placed in the acetabulum, hip internally rotated and flexed, and  retractors placed about the proximal femur, the Mueller retractor and Cobra.  Then, an entry made into the piriformis fossa using the initial entry drill  provided by DePuy, reaming into the area here.  Lateralization then  performed using the trochanteric reamer.  This completed, broaching was  performed from a number 2 up to a number 6 cement stem, the number 6 cement  stem provided the best fit.  With this in place, then, the calcar was  planned using the calcar reamer, protecting the soft tissue structures  appropriately.  The trial reduction was then performed using first a plus 0  then the plus 5 then the plus 8.5 neck with a 47 mm outside diameter shell.  The plus 8.5 provided excellent stabilization of the hip implant.  The hip  was able to be flexed up 90 degrees, internally rotated almost 80-85 degrees  before subluxation of the joint, the bipolar posteriorly.  This was  excellent stabilization, there was very little if any shuck present with  longitudinal traction, and with the hip in full extension, the knee could be  fully flexed to 120 degrees with some slight tightness here.  The hip was  then dislocated and the broach removed.  The permanent implants were brought  onto the field.  A trial with cement plug then performed, a number 3 was  chosen.  The acetabulum was irrigated, again, and epinephrine soaked sponge  placed within the acetabulum.  A bottle brush pulsatile irrigant lavage was  then performed of the proximal femur and this was done until all soft tissue  and extraneous debris had been removed from the femoral intermedullary canal proximally.  This completed, then an epinephrine soaked sponge was placed  within the proximal femoral canal while cement was  being mixed.  Once the  cement had reached the correct consistency, the epinephrine soaked sponge  was removed from the proximal femoral channel, and the channel then dried  using a small dry sponge.  The cement was then placed with in the proximal  femoral canal and then carefully placed using the long nozzle cement gun.  This was then pressurized using the proximal grommet pressurizing sealer.  This completed, the extraneous cement was removed from the proximal portion  of the femur.  The permanent implant with a small  centralizer spacer was  then placed in the correct degree of anteversion, about 10 degrees, and  impacted into place.  Excess cement was removed from about the base of the  calcar.  With the cement removed from around the implant following its  insertion, impaction was further obtained using a mallet.  Note that with  the cement placement and with placement of the prosthesis, a small crack in  the posterior aspect of the femoral neck was noted longitudinal in its  orientation and it was felt that a cerclage band would probably not  stabilize this area to any large degree and that it was probably stabilized  with the implant well below the fracture line.  The primary portion of the  calcar medially as well as anteriorly was felt to be entirely normal, a  small area crack would be treated primarily with decreased weight-bearing  status for a period of six weeks postop.  With this, the implant was felt to  be fully stable.  The cement had completely hardened, all excess cement from  the periprosthesis and acetabular region of the soft tissue was removed.  All the sponges were then removed from the acetabulum and from the implant  area.  Irrigation was then performed.  Careful inspection demonstrated no  further bony material or cement remaining around the hip incision wound.  The Morris taper for the outside diameter shell as well as 28 mm head plus  8.5 neck was then  carefully cleaned and dried and a clamp placed over this  and impacted into place.  The hip was then reduced, placed through a full  range of motion, flexion to 90 degrees with internal rotation nearly 90  degrees before any subluxation.  Full extension of the hip with the knee  able to be flexed greater than 90 degrees.  No instability noted in these  areas.  Irrigation was then performed.  The posterior hip capsule was  reapproximated with interrupted #1 Ethibond sutures.  The piriformis tendon  was reapproximated to the greater trochanter using #1 Ethibond suture.  The  short external rotators were reapproximated to the posterior proximal femur  using a suture passing it through bone with the hip in external rotation and  full extension.  A medium Hemovac drain was placed in the depth of the  incision.  The tensor fascia lata was reapproximated with interrupted #1 Vicryl sutures in simple figure-of-eight fashion with care taken to insure  that the drain was not included.  The superficial fascia of the tensor  muscle was reapproximated with interrupted #1 Ethibond sutures.  The deep  subcu layers were reapproximated with interrupted #1 and 0 Vicryl sutures.  The more superficial layers with interrupted 2-0 Vicryl sutures.  The skin  was closed with running subcu stitch of 4-0 Vicryl.  Tincture of Benzoin and  Steri-Strips applied.  4 by 4s, ABD pad were affixed to the skin with  elastic tape.  The patient was then returned to a supine position, knee  immobilizer was placed with out-stage of the posterior aspect of the leg.  The patient was then reactivated, extubated, and returned to the recovery  room in satisfactory condition.  All instrument and sponge counts were  correct.      Kerrin Champagne, M.D.  Electronically Signed     JEN/MEDQ  D:  01/25/2005  T:  01/26/2005  Job:  [id number]

## 2010-06-26 NOTE — H&P (Signed)
NAMEMALAY, FANTROY NO.:  [id number]   MEDICAL RECORD NO.:  [id number]          PATIENT TYPE:  INP   LOCATION:  1826                         FACILITY:  [facility]   PHYSICIAN:  Corwin Levins, M.D. LHCDATE OF BIRTH:  [birth year]   DATE OF ADMISSION:  04/19/2004  DATE OF DISCHARGE:                                HISTORY & PHYSICAL   CHIEF COMPLAINT:  Found to have SVT in the ER and pulmonary embolus by CT  and now postconversion.   HISTORY OF PRESENT ILLNESS:  [name] is an 77 year old white female who was  brought here by EMS after she was found to have elevated heart rate,  palpitations, shortness of breath, and chest discomfort without diaphoresis,  syncope, or fever, worsening since late evening April 18, 2004.  She was  brought to the ER by EMS.  Heart rate decreased with Adenosine and Cardizem  bolus.  CT of the chest shows large PE bilateral now for admission for  further treatment.  Risk factors include decreased activity and recent  kyphoplasty of L1 in March of 2006.  No lower extremity swelling.  Unusual  per patient.   PAST MEDICAL HISTORY:  1.  Hypertension.  2.  History of in September 2005 with urinary tract infection, fall,      rhabdomyolysis, acute on chronic renal insufficiency, and dehydration.  3.  Chronic renal insufficiency, __________ at baseline.  4.  Lumbar degenerative disk disease and degenerative joint disease.  5.  Osteoporosis with L1 fracture, status post kyphoplasty of L1 in March of      2006.   ALLERGIES:  NO KNOWN DRUG ALLERGIES.   CURRENT MEDICATIONS:  1.  Atenolol 50 mg p.o. daily.  2.  Tramadol 50 mg q.i.d. p.r.n.  3.  Fentanyl 75 mg q.3 days.  4.  Prilosec 20 mg p.o. daily.  5.  Calcium and vitamin D supplementation.   SOCIAL HISTORY:  No tobacco and no alcohol.  Widow.  Lives alone in  [place].  Has a son nearby.   FAMILY HISTORY:  Pancreatic cancer.   REVIEW OF SYSTEMS:  Otherwise noncontributory except for  recent constipation  with Fentanyl despite stool softeners.   PHYSICAL EXAMINATION:  GENERAL:  [name] is an 77 year old white female.  VITAL SIGNS:  Blood pressure 140/90, heart rate 95 post conversion,  afebrile, respirations 20 to 25.  O2 saturation 96% on 2 liters.  CBG 159.  HEENT:  Sclerae clear, TM's clear, pharynx benign.  NECK:  Without lymphadenopathy, JVD, or thyromegaly.  CHEST:  No rales or wheezing.  HEART:  Regular rate and rhythm.  ABDOMEN:  Soft, some diffuse swelling, probably gaseous but nontender.  No  guarding, no rebound.  EXTREMITIES:  No edema, although, she has had some peripheral edema in the  past.   LABORATORY DATA:  CPK-MB negative, D-dimer slightly high at 3.90.  Electrolytes within normal limits, BUN 27, creatinine 1.5, glucose 115.  LFT's with alkaline phosphatase 160, otherwise negative.  White blood cell  count 11.9, hemoglobin 13.9.   EKG:  (1) SVT with approximately 140 beats  per minute on presentation to the  ER.  (2) Sinus rhythm at approximately 72 beats per minute post treatment.   Chest x-ray; no apparent disease.   Chest CT; bilateral large vein segmental pulmonary embolus, large clot  burden noted.  Subsegmental atelectasis.   ASSESSMENT:  1.  Pulmonary embolus.  To be admitted and given IV heparin tonight and      start Coumadin later today, April 20, 2004, and gradually increase      activity as able.  Given the extent of the pulmonary embolus, she will      need long-term Coumadin as long as she is a Coumadin candidate.  2.  Supraventricular tachycardia.  Most likely related to above.  Will check      cardiac enzymes to rule out myocardial infarction, telemetry, O2, and      monitor for any recurrence of arrhythmia.  3.  Hypertension, otherwise stable.  4.  Low back pain status post kyphoplasty, continue fentanyl patch.  5.  Other medical problems, continue home medication as above.      JWJ/MEDQ  D:  04/20/2004  T:  04/20/2004   Job:  [id number]   cc:   Wanda Plump, MD [facility]  [phone] [address] [place], [place] [address]

## 2010-06-26 NOTE — H&P (Signed)
[name], MEIGS NO.:  [id number]   MEDICAL RECORD NO.:  [id number]                   PATIENT TYPE:  EMS   LOCATION:  ED                                   FACILITY:  [facility]   PHYSICIAN:  Wanda Plump, MD [facility]                 DATE OF BIRTH:  [birth year]   DATE OF ADMISSION:  10/23/2003  DATE OF DISCHARGE:                                HISTORY & PHYSICAL   CHIEF COMPLAINT:  Fall.   HISTORY OF PRESENT ILLNESS:  [name] is an 77 year old white female who I  admitted for the first time yesterday at the office. At that time she was  complaining of dysuria and suprapubic discomfort.  She was otherwise doing  well.  Urinalysis showed infection and she was started on Bactrim and  Pyridium.  Her blood pressure was also noticed to be slightly elevated at  the office.  Last night at around 11:30 p.m., she was on her way to the  bathroom but she could not hold the urine long enough so she lost control of  her bowel and subsequently fell.  She injured her elbows but she denies any  head injury or loss of consciousness. She was feeling weak and was unable to  get up.  The family checked on her as usual in the morning and since they  could not get an answer they went to check on her at around 1:30, they her  in the floor fully alert, oriented and cooperative.  EMS was called and she  was brought to the hospital because of the fall.  At present, the patient is  feeling well. She does have some back pain. She has good recollection of the  events and she denies any chest pain, palpitations, headache or slurred  speech at the time of the fall.   PAST MEDICAL HISTORY:  1.  No history of diabetes or coronary artery disease.  2.  Her blood pressure has been elevated before. She states that she      monitors this at home and her blood pressure is always okay.   FAMILY HISTORY:  Brothers and sisters have history of pancreatic cancer,  brain tumors and also a history of  breast cancer.   REVIEW OF SYMPTOMS:  She denies any fever or chills.  The dysuria and lower  abdominal discomfort that she had yesterday was basically gone today.  Last  week, she had a stomach virus and she had nausea, vomiting and some  diarrhea but that is largely resolved.  Again she denies any chest pain,  shortness of breath, palpitations, headache, slurred speech or focal  weakness.   MEDICATIONS:  Not on a routine basis.   ALLERGIES:  No known drug allergies.   PHYSICAL EXAMINATION:  GENERAL:  The patient is alert and oriented in no  apparent distress, cooperative and coherent.  NECK:  Full range of motion.  LUNGS:  __________ bilaterally.  CARDIOVASCULAR:  Regular rhythm without a murmur.  ABDOMEN:  Soft not distended and not tender to palpation. There is no mass.  Yesterday during her office visit, she was moderately tender at the  suprapubic area and left flank.  EXTREMITIES:  No edema.  Both elbows have bruises from the fall. The range  of motion of the elbows is full without any pain. She does have a few small  excoriations there.  NEUROLOGIC:  She has a good recollection of the events.  Motor is normal  except for a very questionable left facial weakness. Extraocular movements  are intact, speech is clear.   LABORATORY DATA AND X-RAYS:  White count is 13.2 with a hemoglobin of 13.6,  platelets of 315.  Potassium is 3.2, creatinine is 2.6, I do not know her  baseline.  Her blood sugar is 117.  Urine did show nitrates and a small  amount of leukocytes and mini bacteria. Total CK is 2297 with an MB index of  0.5.  X-rays of her back show no fracture.   ASSESSMENT:  1.  The patient has been admitted to the hospital after she sustained a      fall. She is also now dehydrated most likely due to the lack of fluids      and food during the time she was down. From the history, I do not      suspect strongly that she had any cardiovascular event or stroke.  Will       however check a head CT because the only finding on exam is minimal and      questionable left facial weakness. She will also get gently hydration      while she is in the hospital. I discussed the plan of cure with the      family who agree.  I also told them that she has been living totally      independent up to now but this may be the first sign that she needs more      help and closer observation.  2.  The patient has a urinary tract infection which at this point is      partially treated. She is afebrile and the urinalysis show only modest      infection. I will continue with the Bactrim by mouth.  3.  Her total CK is elevated.  I suspect this is from the fall. Will keep      her in telemetry, repeat the cardiac enzymes twice and do EKG in the      morning.  4.  The patient has high blood pressure even though her ambulatory numbers      seems to be okay.  I will go ahead and start her on Norvasc.                                               Wanda Plump, MD [facility]    JEP/MEDQ  D:  10/23/2003  T:  10/23/2003  Job:  [id number]

## 2010-06-26 NOTE — Discharge Summary (Signed)
[name], [name]:  [id number]   MEDICAL RECORD NO.:  [id number]          PATIENT TYPE:  INP   LOCATION:  6705                         FACILITY:  [facility]   PHYSICIAN:  Rene Paci, M.D. LHCDATE OF BIRTH:  Jan 09, 1921   DATE OF ADMISSION:  04/19/2004  DATE OF DISCHARGE:  04/28/2004                                 DISCHARGE SUMMARY   DISCHARGE DIAGNOSES:  1.  Pulmonary embolus.  2.  Acute hypoxic respiratory infection.  3.  Uncontrolled hypertension.  4.  Back pain.  5.  Debilitation.   BRIEF ADMISSION HISTORY:  [name] is an 77 year old white female who  resented to the emergency department with SVT.  The patient had been  complaining of heart palpitations and shortness of breath associated with  chest discomfort.  In the ER, her heart rate was controlled with adenosine  and Cardizem.  CT of the chest revealed a large bilateral PE or saddle  embolus.   PAST MEDICAL HISTORY:  1.  Low back pain, status post kyphoplasty at L1 in March of 2006 with      decreased activity since that time.  2.  Chronic renal insufficiency.  3.  Osteoporosis.  4.  Lumbar degenerative disk disease and degenerative joint disease.   HOSPITAL COURSE:  #1 - PULMONARY:  The patient presented with acute hypoxic  respiratory infection secondary to PE.  The patient was started on heparin  and Coumadin.  Once therapeutic, the patient's heparin was discontinued.  The patient's respiratory status has improved.  Currently she is maintain  saturations of 90-92% on room air.   #2 - MENTAL STATUS CHANGES:  The patient had some confusion during this  admission.  We felt this was multifactorial secondary to sundowning, as well  as medication side effect from her fentanyl and Restoril, as well as some  hypoxemia.  The patient's mental status is much better at this time.  We did  reduce the patient's fentanyl dose and we discontinued her Restoril.  She is  also no longer hypoxic.   #3 - LOW BACK PAIN:  Status post kyphoplasty on April 13, 2004.  The patient  was scheduled to follow up with Dr. Corliss Skains on April 27, 2004.  We did  notify them of the patient's admission and they will reschedule with the  patient.   #4 - HYPERTENSION:  The patient's blood pressures were uncontrolled.  We did  add clonidine and HCTZ to the patient's atenolol regimen.   #5 - DEBILITATION:  The patient's family was concerned that she was not safe  to live alone.  We did have physical therapy see the patient and they noted  that she walked 150 feet with minimal guard assist.  They felt she would  benefit from home health PT.  The patient's family still was interested in  short-term skilled nursing facility.  Initially they had convinced the  patient that this was something she would agree to.  However, the patient  has once again changed her mind and would prefer to go home with home health  physical therapy.  We do feel the patient is stable for discharge with home  health PT.   LABORATORIES AT DISCHARGE:  Pro time 21.6, INR 2.4.   MEDICATIONS AT DISCHARGE:  1.  Atenolol 50 mg daily.  2.  Prilosec 20 mg daily.  3.  HCTZ 25 mg one-half tablet daily.  4.  Coumadin 3 mg daily.  5.  Duragesic 3 mcg/hr to be changed every 72 hours.  6.  Clonidine 0.1 mg b.i.d.  7.  Tramadol p.r.n.   FOLLOWUP:  Pro time on Thursday, April 30, 2004, at 9:30 a.m. at Dr. Leta Jungling  office.  Follow up with Dr. Drue Novel on Wednesday, May 06, 2004, at __________  a.m.      LC/MEDQ  D:  04/28/2004  T:  04/28/2004  Job:  [id number]   cc:   Wanda Plump, MD [facility]  [phone] [address] [place], [place] [address]

## 2010-11-05 LAB — CBC
HCT: 35.3 — ABNORMAL LOW
HCT: 40
HCT: 31.8 — ABNORMAL LOW
HCT: 40.6
HCT: 46.7 — ABNORMAL HIGH
HCT: 50.6 — ABNORMAL HIGH
Hemoglobin: 11.9 — ABNORMAL LOW
Hemoglobin: 10.7 — ABNORMAL LOW
Hemoglobin: 17.5 — ABNORMAL HIGH
MCHC: 33.5
MCHC: 33.6
MCHC: 33.7
MCV: 94.8
MCV: 94.9
MCV: 93.2
MCV: 93.7
MCV: 94
MCV: 94.7
MCV: 95.2
MCV: 95.4
MCV: 95.4
Platelets: 201
Platelets: 177
Platelets: 189
Platelets: 299
RBC: 3.77 — ABNORMAL LOW
RBC: 4.21
RBC: 4.81
RBC: 3.34 — ABNORMAL LOW
RBC: 3.58 — ABNORMAL LOW
RBC: 3.81 — ABNORMAL LOW
RBC: 5.01
RDW: 15.9 — ABNORMAL HIGH
RDW: 16.2 — ABNORMAL HIGH
RDW: 16.1 — ABNORMAL HIGH
RDW: 16.4 — ABNORMAL HIGH
RDW: 18.3 — ABNORMAL HIGH
WBC: 20 — ABNORMAL HIGH
WBC: 25.2 — ABNORMAL HIGH
WBC: 14.7 — ABNORMAL HIGH
WBC: 19.7 — ABNORMAL HIGH
WBC: 27.6 — ABNORMAL HIGH
WBC: 27.7 — ABNORMAL HIGH

## 2010-11-05 LAB — DIFFERENTIAL
Basophils Absolute: 0
Basophils Relative: 0
Eosinophils Relative: 0
Eosinophils Relative: 0
Lymphocytes Relative: 7 — ABNORMAL LOW
Lymphs Abs: 1.3
Lymphs Abs: 1.4
Monocytes Absolute: 0 — ABNORMAL LOW
Monocytes Relative: 1 — ABNORMAL LOW
Neutro Abs: 18.4 — ABNORMAL HIGH

## 2010-11-05 LAB — BASIC METABOLIC PANEL
BUN: 51 — ABNORMAL HIGH
BUN: 26 — ABNORMAL HIGH
BUN: 40 — ABNORMAL HIGH
BUN: 59 — ABNORMAL HIGH
BUN: 66 — ABNORMAL HIGH
CO2: 21
CO2: 20
CO2: 23
CO2: 25
Calcium: 9.6
Calcium: 8.6
Chloride: 103
Chloride: 108
Chloride: 112
Chloride: 117 — ABNORMAL HIGH
Chloride: 125 — ABNORMAL HIGH
Chloride: >130 — ABNORMAL HIGH
Chloride: >130 — ABNORMAL HIGH
Chloride: >130 — ABNORMAL HIGH
Creatinine, Ser: 3.33 — ABNORMAL HIGH
Creatinine, Ser: 0.92
Creatinine, Ser: 0.96
Creatinine, Ser: 1.11
Creatinine, Ser: 1.21 — ABNORMAL HIGH
Creatinine, Ser: 1.59 — ABNORMAL HIGH
GFR calc Af Amer: 16 — ABNORMAL LOW
GFR calc Af Amer: 30 — ABNORMAL LOW
GFR calc Af Amer: 51 — ABNORMAL LOW
GFR calc Af Amer: 52 — ABNORMAL LOW
GFR calc Af Amer: 54 — ABNORMAL LOW
GFR calc Af Amer: 57 — ABNORMAL LOW
GFR calc Af Amer: >60
GFR calc Af Amer: >60
GFR calc non Af Amer: 25 — ABNORMAL LOW
GFR calc non Af Amer: 31 — ABNORMAL LOW
GFR calc non Af Amer: 42 — ABNORMAL LOW
GFR calc non Af Amer: 49 — ABNORMAL LOW
GFR calc non Af Amer: 55 — ABNORMAL LOW
Glucose, Bld: 122 — ABNORMAL HIGH
Glucose, Bld: 75
Glucose, Bld: 116 — ABNORMAL HIGH
Glucose, Bld: 85
Potassium: 3.5
Potassium: 3.1 — ABNORMAL LOW
Potassium: 3.3 — ABNORMAL LOW
Potassium: 3.4 — ABNORMAL LOW
Potassium: 3.4 — ABNORMAL LOW
Potassium: 3.5
Potassium: 3.5
Sodium: 146 — ABNORMAL HIGH
Sodium: 151 — ABNORMAL HIGH
Sodium: 162

## 2010-11-05 LAB — COMPREHENSIVE METABOLIC PANEL
ALT: 29
AST: 40 — ABNORMAL HIGH
CO2: 22
Chloride: 106
GFR calc Af Amer: 18 — ABNORMAL LOW
GFR calc non Af Amer: 15 — ABNORMAL LOW
Sodium: 142
Total Bilirubin: 0.6

## 2010-11-05 LAB — URINALYSIS, ROUTINE W REFLEX MICROSCOPIC
Ketones, ur: 15 — AB
Nitrite: NEGATIVE
Protein, ur: NEGATIVE
Urobilinogen, UA: 1
pH: 5

## 2010-11-05 LAB — B-NATRIURETIC PEPTIDE (CONVERTED LAB): Pro B Natriuretic peptide (BNP): 232 — ABNORMAL HIGH

## 2010-11-05 LAB — OSMOLALITY, URINE: Osmolality, Ur: 608

## 2010-11-05 LAB — VANCOMYCIN, TROUGH: Vancomycin Tr: <5 — ABNORMAL LOW

## 2010-11-05 LAB — D-DIMER, QUANTITATIVE: D-Dimer, Quant: 8.82 — ABNORMAL HIGH

## 2010-11-05 LAB — URINE CULTURE: Colony Count: >=100000

## 2010-11-05 LAB — CULTURE, BLOOD (ROUTINE X 2): Culture: NO GROWTH

## 2010-11-05 LAB — SODIUM, URINE, RANDOM: Sodium, Ur: 77

## 2010-11-05 LAB — CK TOTAL AND CKMB (NOT AT ARMC)
Relative Index: 1.5
Relative Index: 2.5

## 2010-11-05 LAB — URINE MICROSCOPIC-ADD ON

## 2010-11-17 LAB — CARDIAC PANEL(CRET KIN+CKTOT+MB+TROPI)
CK, MB: 2.4
Relative Index: INVALID
Relative Index: INVALID
Total CK: 60
Troponin I: 0.01

## 2010-11-17 LAB — BASIC METABOLIC PANEL
BUN: 10
BUN: 11
BUN: 11
Chloride: 98
Chloride: 101
Creatinine, Ser: 1.25 — ABNORMAL HIGH
GFR calc non Af Amer: 41 — ABNORMAL LOW
Glucose, Bld: 118 — ABNORMAL HIGH
Glucose, Bld: 101 — ABNORMAL HIGH
Potassium: 3.8
Potassium: 4.3
Sodium: 133 — ABNORMAL LOW

## 2010-11-17 LAB — CK TOTAL AND CKMB (NOT AT ARMC)
CK, MB: 2.7
Relative Index: INVALID
Total CK: 50

## 2010-11-17 LAB — CBC
HCT: 36
HCT: 37.6
HCT: 40.3
Hemoglobin: 12.5
Hemoglobin: 13.5
MCHC: 33.3
MCHC: 33.5
MCV: 94.4
MCV: 94.9
MCV: 93.4
Platelets: 187
Platelets: 197
Platelets: 251
RDW: 14.5
RDW: 14.5
RDW: 14.9
RDW: 13.7
WBC: 6.9

## 2010-11-17 LAB — COMPREHENSIVE METABOLIC PANEL
Albumin: 3.6
Alkaline Phosphatase: 57
BUN: 12
CO2: 26
Chloride: 101
Potassium: 4.1
Total Bilirubin: 0.8

## 2010-11-17 LAB — APTT
aPTT: 27
aPTT: 22 — ABNORMAL LOW

## 2010-11-17 LAB — PROTIME-INR: INR: 1

## 2010-11-17 LAB — POCT CARDIAC MARKERS
CKMB, poc: 1.7
Myoglobin, poc: 73.4
Operator id: 234501

## 2010-11-17 LAB — DIFFERENTIAL
Basophils Absolute: 0
Basophils Relative: 1
Eosinophils Absolute: 0
Eosinophils Relative: 1
Eosinophils Relative: 0
Lymphs Abs: 1.9
Monocytes Absolute: 0.6
Monocytes Absolute: 0.6
Neutro Abs: 4.5

## 2010-11-17 LAB — HEPARIN LEVEL (UNFRACTIONATED): Heparin Unfractionated: 1.36 — ABNORMAL HIGH

## 2010-11-17 LAB — T4, FREE: Free T4: 1.13

## 2012-05-04 DIAGNOSIS — I4891 Unspecified atrial fibrillation: Secondary | ICD-10-CM

## 2012-05-04 DIAGNOSIS — F411 Generalized anxiety disorder: Secondary | ICD-10-CM

## 2012-05-04 DIAGNOSIS — F0280 Dementia in other diseases classified elsewhere without behavioral disturbance: Secondary | ICD-10-CM

## 2012-05-04 DIAGNOSIS — I1310 Hypertensive heart and chronic kidney disease without heart failure, with stage 1 through stage 4 chronic kidney disease, or unspecified chronic kidney disease: Secondary | ICD-10-CM

## 2012-05-04 DIAGNOSIS — E46 Unspecified protein-calorie malnutrition: Secondary | ICD-10-CM

## 2012-05-04 DIAGNOSIS — E039 Hypothyroidism, unspecified: Secondary | ICD-10-CM

## 2012-05-04 DIAGNOSIS — G309 Alzheimer's disease, unspecified: Secondary | ICD-10-CM

## 2012-05-04 DIAGNOSIS — N183 Chronic kidney disease, stage 3 (moderate): Secondary | ICD-10-CM

## 2012-05-15 ENCOUNTER — Other Ambulatory Visit: Payer: Self-pay | Admitting: *Deleted

## 2012-05-15 MED ORDER — CLONAZEPAM 0.5 MG PO TABS: ORAL_TABLET | ORAL | Status: DC

## 2012-06-01 DIAGNOSIS — E039 Hypothyroidism, unspecified: Secondary | ICD-10-CM

## 2012-06-01 DIAGNOSIS — I798 Other disorders of arteries, arterioles and capillaries in diseases classified elsewhere: Secondary | ICD-10-CM

## 2012-06-01 DIAGNOSIS — E1159 Type 2 diabetes mellitus with other circulatory complications: Secondary | ICD-10-CM

## 2012-06-01 DIAGNOSIS — G309 Alzheimer's disease, unspecified: Secondary | ICD-10-CM

## 2012-06-01 DIAGNOSIS — E1139 Type 2 diabetes mellitus with other diabetic ophthalmic complication: Secondary | ICD-10-CM

## 2012-06-01 DIAGNOSIS — N183 Chronic kidney disease, stage 3 (moderate): Secondary | ICD-10-CM

## 2012-06-01 DIAGNOSIS — F0280 Dementia in other diseases classified elsewhere without behavioral disturbance: Secondary | ICD-10-CM

## 2012-06-01 DIAGNOSIS — E1129 Type 2 diabetes mellitus with other diabetic kidney complication: Secondary | ICD-10-CM

## 2012-06-29 DIAGNOSIS — I798 Other disorders of arteries, arterioles and capillaries in diseases classified elsewhere: Secondary | ICD-10-CM

## 2012-06-29 DIAGNOSIS — F0280 Dementia in other diseases classified elsewhere without behavioral disturbance: Secondary | ICD-10-CM

## 2012-06-29 DIAGNOSIS — G309 Alzheimer's disease, unspecified: Secondary | ICD-10-CM

## 2012-06-29 DIAGNOSIS — E1139 Type 2 diabetes mellitus with other diabetic ophthalmic complication: Secondary | ICD-10-CM

## 2012-06-29 DIAGNOSIS — E1159 Type 2 diabetes mellitus with other circulatory complications: Secondary | ICD-10-CM

## 2012-06-29 DIAGNOSIS — E1129 Type 2 diabetes mellitus with other diabetic kidney complication: Secondary | ICD-10-CM

## 2012-06-29 DIAGNOSIS — N183 Chronic kidney disease, stage 3 (moderate): Secondary | ICD-10-CM

## 2012-06-29 DIAGNOSIS — E039 Hypothyroidism, unspecified: Secondary | ICD-10-CM

## 2012-07-25 DIAGNOSIS — I4891 Unspecified atrial fibrillation: Secondary | ICD-10-CM

## 2012-07-25 DIAGNOSIS — I798 Other disorders of arteries, arterioles and capillaries in diseases classified elsewhere: Secondary | ICD-10-CM

## 2012-07-25 DIAGNOSIS — G309 Alzheimer's disease, unspecified: Secondary | ICD-10-CM

## 2012-07-25 DIAGNOSIS — R1310 Dysphagia, unspecified: Secondary | ICD-10-CM

## 2012-07-25 DIAGNOSIS — N6489 Other specified disorders of breast: Secondary | ICD-10-CM

## 2012-07-25 DIAGNOSIS — I059 Rheumatic mitral valve disease, unspecified: Secondary | ICD-10-CM

## 2012-07-25 DIAGNOSIS — E1169 Type 2 diabetes mellitus with other specified complication: Secondary | ICD-10-CM

## 2012-07-25 DIAGNOSIS — F0280 Dementia in other diseases classified elsewhere without behavioral disturbance: Secondary | ICD-10-CM

## 2012-07-25 DIAGNOSIS — L8992 Pressure ulcer of unspecified site, stage 2: Secondary | ICD-10-CM

## 2012-08-22 ENCOUNTER — Non-Acute Institutional Stay (SKILLED_NURSING_FACILITY): Payer: PRIVATE HEALTH INSURANCE | Admitting: Internal Medicine

## 2012-08-22 DIAGNOSIS — G309 Alzheimer's disease, unspecified: Secondary | ICD-10-CM | POA: Insufficient documentation

## 2012-08-22 DIAGNOSIS — I4891 Unspecified atrial fibrillation: Secondary | ICD-10-CM

## 2012-08-22 DIAGNOSIS — E039 Hypothyroidism, unspecified: Secondary | ICD-10-CM | POA: Insufficient documentation

## 2012-08-22 DIAGNOSIS — F0280 Dementia in other diseases classified elsewhere without behavioral disturbance: Secondary | ICD-10-CM

## 2012-08-22 NOTE — Progress Notes (Signed)
PROGRESS NOTE  DATE: 08/22/2012  FACILITY: Nursing Home Location: [facility]  LEVEL OF CARE: SNF (31)  Routine Visit  CHIEF COMPLAINT:  Manage Alzheimer's dementia, atrial fibrillation and hypothyroidism  HISTORY OF PRESENT ILLNESS:  REASSESSMENT OF ONGOING PROBLEM(S):  ATRIAL FIBRILLATION: the patients atrial fibrillation remains stable.  The staff deny DOE, tachycardia, orthopnea, transient neurological sx, pedal edema, palpitations, & PNDs.  No complications noted from the medications currently being used.  DEMENTIA: The dementia remaines stable and continues to function adequately in the current living environment with supervision.  The patient has had little changes in behavior. No complications noted from the medications presently being used. Advanced. Patient does not follow commands.  HYPOTHYROIDISM: The hypothyroidism remains stable. No complications noted from the medications presently being used.  The patient denies fatigue or constipation.  Last TSH 4.64 in 6/14.  PAST MEDICAL HISTORY : Reviewed.  No changes.  CURRENT MEDICATIONS: Reviewed per [facility]  REVIEW OF SYSTEMS: Unobtainable due to dementia.  PHYSICAL EXAMINATION  VS:  T 97.2      P 74      RR 20     BP 135/63     POX %     WT (Lb) 120.4  GENERAL: no acute distress, normal body habitus EYES: conjunctivae normal, sclerae normal, normal eye lids NECK: supple, trachea midline, no neck masses, no thyroid tenderness, no thyromegaly LYMPHATICS: no LAN in the neck, no supraclavicular LAN RESPIRATORY: breathing is even & unlabored, BS CTAB CARDIAC: RRR, no murmur,no extra heart sounds, no edema GI: abdomen soft, normal BS, no masses, no tenderness, no hepatomegaly, no splenomegaly PSYCHIATRIC: the patient is alert & disoriented, affect & behavior appropriate  LABS/RADIOLOGY:  6/14 digoxin 0.7, CBC normal, glucose 118, BUN 27, creatinine 1.18 otherwise BMP normal 5/14 glucose 118,  BUN 29, creatinine 1.7 otherwise CMP normal 3/14 hemoglobin A1c 6.2  ASSESSMENT/PLAN:  Alzheimer's dementia-advanced. Atrial fibrillation-rate controlled. Hypothyroidism-stable. Chronic kidney disease stage III- stable. Diabetes mellitus-diet controlled. Depression-stable.  CPT CODE: 16109

## 2012-09-26 ENCOUNTER — Non-Acute Institutional Stay (SKILLED_NURSING_FACILITY): Payer: PRIVATE HEALTH INSURANCE | Admitting: Internal Medicine

## 2012-09-26 DIAGNOSIS — N183 Chronic kidney disease, stage 3 unspecified: Secondary | ICD-10-CM

## 2012-09-26 DIAGNOSIS — E039 Hypothyroidism, unspecified: Secondary | ICD-10-CM

## 2012-09-26 DIAGNOSIS — I4891 Unspecified atrial fibrillation: Secondary | ICD-10-CM

## 2012-09-26 DIAGNOSIS — F0280 Dementia in other diseases classified elsewhere without behavioral disturbance: Secondary | ICD-10-CM

## 2012-09-30 NOTE — Progress Notes (Signed)
PROGRESS NOTE  DATE: 09-26-12  FACILITY: Nursing Home Location: [facility]  LEVEL OF CARE: SNF (31)  Routine Visit  CHIEF COMPLAINT:  Manage Alzheimer's dementia, atrial fibrillation and hypothyroidism  HISTORY OF PRESENT ILLNESS:  REASSESSMENT OF ONGOING PROBLEM(S):  ATRIAL FIBRILLATION: the patients atrial fibrillation remains stable.  The staff deny DOE, tachycardia, orthopnea, transient neurological sx, pedal edema, palpitations, & PNDs.  No complications noted from the medications currently being used.  DEMENTIA: The dementia remaines stable and continues to function adequately in the current living environment with supervision.  The patient has had little changes in behavior. No complications noted from the medications presently being used. Advanced. Patient does not follow commands.  HYPOTHYROIDISM: The hypothyroidism remains stable. No complications noted from the medications presently being used.  The patient denies fatigue or constipation.  Last TSH 4.64 in 6/14, in 7-14 TSH 1.32  PAST MEDICAL HISTORY : Reviewed.  No changes.  CURRENT MEDICATIONS: Reviewed per [facility]  REVIEW OF SYSTEMS: Unobtainable due to dementia.  PHYSICAL EXAMINATION  VS:  T 97.5      P 72      RR 20     BP 118/85     POX %     WT (Lb) 120.2  GENERAL: no acute distress, normal body habitus EYES: conjunctivae normal, sclerae normal, normal eye lids NECK: supple, trachea midline, no neck masses, no thyroid tenderness, no thyromegaly LYMPHATICS: no LAN in the neck, no supraclavicular LAN RESPIRATORY: breathing is even & unlabored, BS CTAB CARDIAC: RRR, no murmur,no extra heart sounds, no edema GI: abdomen soft, normal BS, no masses, no tenderness, no hepatomegaly, no splenomegaly PSYCHIATRIC: the patient is alert & disoriented, affect & behavior appropriate  LABS/RADIOLOGY:  6/14 digoxin 0.7, CBC normal, glucose 118, BUN 27, creatinine 1.18 otherwise BMP normal 5/14  glucose 118, BUN 29, creatinine 1.7 otherwise CMP normal 3/14 hemoglobin A1c 6.2  ASSESSMENT/PLAN:  Alzheimer's dementia-advanced. Atrial fibrillation-rate controlled. Hypothyroidism-stable. Chronic kidney disease stage III- stable. Diabetes mellitus-diet controlled. Depression-stable.  CPT CODE: 16109

## 2012-10-31 ENCOUNTER — Non-Acute Institutional Stay (SKILLED_NURSING_FACILITY): Payer: PRIVATE HEALTH INSURANCE | Admitting: Internal Medicine

## 2012-10-31 DIAGNOSIS — F0280 Dementia in other diseases classified elsewhere without behavioral disturbance: Secondary | ICD-10-CM

## 2012-10-31 DIAGNOSIS — I4891 Unspecified atrial fibrillation: Secondary | ICD-10-CM

## 2012-10-31 DIAGNOSIS — E039 Hypothyroidism, unspecified: Secondary | ICD-10-CM

## 2012-10-31 NOTE — Progress Notes (Signed)
PROGRESS NOTE  DATE: 10-31-12  FACILITY: Nursing Home Location: [facility]  LEVEL OF CARE: SNF (31)  Routine Visit  CHIEF COMPLAINT:  Manage Alzheimer's dementia, atrial fibrillation and hypothyroidism  HISTORY OF PRESENT ILLNESS:  REASSESSMENT OF ONGOING PROBLEM(S):  ATRIAL FIBRILLATION: the patients atrial fibrillation remains stable.  The staff deny DOE, tachycardia, orthopnea, transient neurological sx, pedal edema, palpitations, & PNDs.  No complications noted from the medications currently being used.  DEMENTIA: The dementia remaines stable and continues to function adequately in the current living environment with supervision.  The patient has had little changes in behavior. No complications noted from the medications presently being used. Advanced. Patient does not follow commands.  HYPOTHYROIDISM: The hypothyroidism remains stable. No complications noted from the medications presently being used.  The patient denies fatigue or constipation.  Last TSH 4.64 in 6/14, in 7-14 TSH 1.32  PAST MEDICAL HISTORY : Reviewed.  No changes.  CURRENT MEDICATIONS: Reviewed per [facility]  REVIEW OF SYSTEMS: Unobtainable due to dementia.  PHYSICAL EXAMINATION  VS:  T 97.1      P 78      RR 18     BP 136/76    POX %     WT (Lb) 123.6  GENERAL: no acute distress, normal body habitus EYES: conjunctivae normal, sclerae normal, normal eye lids NECK: supple, trachea midline, no neck masses, no thyroid tenderness, no thyromegaly LYMPHATICS: no LAN in the neck, no supraclavicular LAN RESPIRATORY: breathing is even & unlabored, BS CTAB CARDIAC: RRR, no murmur,no extra heart sounds, no edema GI: abdomen soft, normal BS, no masses, no tenderness, no hepatomegaly, no splenomegaly PSYCHIATRIC: the patient is alert & disoriented, affect & behavior appropriate  LABS/RADIOLOGY:  6/14 digoxin 0.7, CBC normal, glucose 118, BUN 27, creatinine 1.18 otherwise BMP normal 5/14  glucose 118, BUN 29, creatinine 1.7 otherwise CMP normal 3/14 hemoglobin A1c 6.2  ASSESSMENT/PLAN:  Alzheimer's dementia-advanced. Atrial fibrillation-rate controlled. Hypothyroidism-stable. Chronic kidney disease stage III- stable. Diabetes mellitus-diet controlled. Check hemoglobin A1c Depression-stable.  CPT CODE: 14782

## 2012-11-14 ENCOUNTER — Other Ambulatory Visit: Payer: Self-pay | Admitting: *Deleted

## 2012-11-14 MED ORDER — CLONAZEPAM 0.5 MG PO TABS: ORAL_TABLET | ORAL | Status: DC

## 2012-12-04 ENCOUNTER — Non-Acute Institutional Stay (SKILLED_NURSING_FACILITY): Payer: PRIVATE HEALTH INSURANCE | Admitting: Internal Medicine

## 2012-12-04 ENCOUNTER — Other Ambulatory Visit: Payer: Self-pay | Admitting: *Deleted

## 2012-12-04 DIAGNOSIS — I4891 Unspecified atrial fibrillation: Secondary | ICD-10-CM

## 2012-12-04 DIAGNOSIS — F0280 Dementia in other diseases classified elsewhere without behavioral disturbance: Secondary | ICD-10-CM

## 2012-12-04 DIAGNOSIS — E039 Hypothyroidism, unspecified: Secondary | ICD-10-CM

## 2012-12-04 MED ORDER — CLONAZEPAM 0.5 MG PO TABS: ORAL_TABLET | ORAL | Status: DC

## 2012-12-04 NOTE — Progress Notes (Signed)
PROGRESS NOTE  DATE: 12-04-12  FACILITY: Nursing Home Location: [facility]  LEVEL OF CARE: SNF (31)  Routine Visit  CHIEF COMPLAINT:  Manage Alzheimer's dementia, atrial fibrillation and hypothyroidism  HISTORY OF PRESENT ILLNESS:  REASSESSMENT OF ONGOING PROBLEM(S):  ATRIAL FIBRILLATION: the patients atrial fibrillation remains stable.  The staff deny DOE, tachycardia, orthopnea, transient neurological sx, pedal edema, palpitations, & PNDs.  No complications noted from the medications currently being used.  DEMENTIA: The dementia remaines stable and continues to function adequately in the current living environment with supervision.  The patient has had little changes in behavior. No complications noted from the medications presently being used. Advanced. Patient does not follow commands.  HYPOTHYROIDISM: The hypothyroidism remains stable. No complications noted from the medications presently being used.  The patient denies fatigue or constipation.  Last TSH 4.64 in 6/14, in 7-14 TSH 1.32, in 10/14 TSH 5.289  PAST MEDICAL HISTORY : Reviewed.  No changes.  CURRENT MEDICATIONS: Reviewed per [facility]  REVIEW OF SYSTEMS: Unobtainable due to dementia.  PHYSICAL EXAMINATION  VS:  T 97.1      P 60      RR 22     BP 192/112    POX %     WT (Lb) 126  GENERAL: no acute distress, normal body habitus EYES: conjunctivae normal, sclerae normal, normal eye lids NECK: supple, trachea midline, no neck masses, no thyroid tenderness, no thyromegaly LYMPHATICS: no LAN in the neck, no supraclavicular LAN RESPIRATORY: breathing is even & unlabored, BS CTAB CARDIAC: RRR, no murmur,no extra heart sounds, no edema GI: abdomen soft, normal BS, no masses, no tenderness, no hepatomegaly, no splenomegaly PSYCHIATRIC: the patient is alert & disoriented, affect & behavior appropriate  LABS/RADIOLOGY:  10-14 CO2 13, glucose 106, BUN 24, creatinine 1.37 otherwise BMP normal,  digoxin 0.4 9-14 hemoglobin A1c 6  6/14 digoxin 0.7, CBC normal, glucose 118, BUN 27, creatinine 1.18 otherwise BMP normal 5/14 glucose 118, BUN 29, creatinine 1.7 otherwise CMP normal 3/14 hemoglobin A1c 6.2  ASSESSMENT/PLAN:  Alzheimer's dementia-advanced. Atrial fibrillation-rate controlled. Hypothyroidism-Synthroid was increased Chronic kidney disease stage III- stable. Diabetes mellitus-diet controlled.  Depression-stable. Anxiety-Klonopin started  CPT CODE: 16109

## 2012-12-05 ENCOUNTER — Other Ambulatory Visit: Payer: Self-pay | Admitting: *Deleted

## 2012-12-05 MED ORDER — CLONAZEPAM 0.5 MG PO TABS: ORAL_TABLET | ORAL | Status: DC

## 2013-01-01 ENCOUNTER — Encounter: Payer: Self-pay | Admitting: Internal Medicine

## 2013-01-01 ENCOUNTER — Non-Acute Institutional Stay (SKILLED_NURSING_FACILITY): Payer: PRIVATE HEALTH INSURANCE | Admitting: Internal Medicine

## 2013-01-01 DIAGNOSIS — F0280 Dementia in other diseases classified elsewhere without behavioral disturbance: Secondary | ICD-10-CM

## 2013-01-01 DIAGNOSIS — I4891 Unspecified atrial fibrillation: Secondary | ICD-10-CM

## 2013-01-01 DIAGNOSIS — E039 Hypothyroidism, unspecified: Secondary | ICD-10-CM

## 2013-01-01 NOTE — Progress Notes (Signed)
PROGRESS NOTE  DATE: 01-01-13  FACILITY: Nursing Home Location: [facility]  LEVEL OF CARE: SNF (31)  Routine Visit  CHIEF COMPLAINT:  Manage Alzheimer's dementia, atrial fibrillation and hypothyroidism  HISTORY OF PRESENT ILLNESS:  REASSESSMENT OF ONGOING PROBLEM(S):  ATRIAL FIBRILLATION: the patients atrial fibrillation remains stable.  The staff deny DOE, tachycardia, orthopnea, transient neurological sx, pedal edema, palpitations, & PNDs.  No complications noted from the medications currently being used.  DEMENTIA: The dementia remaines stable and continues to function adequately in the current living environment with supervision.  The patient has had little changes in behavior. No complications noted from the medications presently being used. Advanced. Patient does not follow commands.  HYPOTHYROIDISM: The hypothyroidism remains stable. No complications noted from the medications presently being used.  The patient denies fatigue or constipation.  Last TSH 4.64 in 6/14, in 7-14 TSH 1.32, in 10/14 TSH 5.289, in 11- 14 TSH 1.14  PAST MEDICAL HISTORY : Reviewed.  No changes.  CURRENT MEDICATIONS: Reviewed per [facility]  REVIEW OF SYSTEMS: Unobtainable due to dementia.  PHYSICAL EXAMINATION  VS:  T 98.6     P 79     RR 19    BP 115/66    POX %     WT (Lb) 121  GENERAL: no acute distress, normal body habitus EYES: conjunctivae normal, sclerae normal, normal eye lids NECK: supple, trachea midline, no neck masses, no thyroid tenderness, no thyromegaly LYMPHATICS: no LAN in the neck, no supraclavicular LAN RESPIRATORY: breathing is even & unlabored, BS CTAB CARDIAC: RRR, no murmur,no extra heart sounds, no edema GI: abdomen soft, normal BS, no masses, no tenderness, no hepatomegaly, no splenomegaly PSYCHIATRIC: the patient is alert & disoriented, affect & behavior appropriate  LABS/RADIOLOGY:  10-14 CO2 13, glucose 106, BUN 24, creatinine 1.37 otherwise  BMP normal, digoxin 0.4, CBC normal 9-14 hemoglobin A1c 6  6/14 digoxin 0.7, CBC normal, glucose 118, BUN 27, creatinine 1.18 otherwise BMP normal 5/14 glucose 118, BUN 29, creatinine 1.7 otherwise CMP normal 3/14 hemoglobin A1c 6.2  ASSESSMENT/PLAN:  Alzheimer's dementia-advanced. Depakote was started Atrial fibrillation-rate controlled. Hypothyroidism-well controlled Chronic kidney disease stage III- stable. Diabetes mellitus-diet controlled.  Depression-stable. Anxiety-Klonopin discontinued Insomnia-restoril was started Check Depakote level  CPT CODE: 21308

## 2013-01-05 ENCOUNTER — Emergency Department (HOSPITAL_BASED_OUTPATIENT_CLINIC_OR_DEPARTMENT_OTHER)
Admission: EM | Admit: 2013-01-05 | Discharge: 2013-01-06 | Disposition: A | Discharge status: Home / Self Care | Payer: PRIVATE HEALTH INSURANCE | Attending: Emergency Medicine | Admitting: Emergency Medicine

## 2013-01-05 ENCOUNTER — Encounter (HOSPITAL_BASED_OUTPATIENT_CLINIC_OR_DEPARTMENT_OTHER): Payer: Self-pay | Admitting: Emergency Medicine

## 2013-01-05 DIAGNOSIS — Z79899 Other long term (current) drug therapy: Secondary | ICD-10-CM | POA: Insufficient documentation

## 2013-01-05 DIAGNOSIS — I4891 Unspecified atrial fibrillation: Secondary | ICD-10-CM | POA: Insufficient documentation

## 2013-01-05 DIAGNOSIS — F3289 Other specified depressive episodes: Secondary | ICD-10-CM | POA: Insufficient documentation

## 2013-01-05 DIAGNOSIS — F0280 Dementia in other diseases classified elsewhere without behavioral disturbance: Secondary | ICD-10-CM | POA: Insufficient documentation

## 2013-01-05 DIAGNOSIS — Y929 Unspecified place or not applicable: Secondary | ICD-10-CM | POA: Insufficient documentation

## 2013-01-05 DIAGNOSIS — G309 Alzheimer's disease, unspecified: Secondary | ICD-10-CM | POA: Insufficient documentation

## 2013-01-05 DIAGNOSIS — Z7982 Long term (current) use of aspirin: Secondary | ICD-10-CM | POA: Insufficient documentation

## 2013-01-05 DIAGNOSIS — N183 Chronic kidney disease, stage 3 unspecified: Secondary | ICD-10-CM | POA: Insufficient documentation

## 2013-01-05 DIAGNOSIS — Y939 Activity, unspecified: Secondary | ICD-10-CM | POA: Insufficient documentation

## 2013-01-05 DIAGNOSIS — S91309A Unspecified open wound, unspecified foot, initial encounter: Secondary | ICD-10-CM | POA: Insufficient documentation

## 2013-01-05 DIAGNOSIS — E119 Type 2 diabetes mellitus without complications: Secondary | ICD-10-CM | POA: Insufficient documentation

## 2013-01-05 DIAGNOSIS — W269XXA Contact with unspecified sharp object(s), initial encounter: Secondary | ICD-10-CM | POA: Insufficient documentation

## 2013-01-05 DIAGNOSIS — S91311A Laceration without foreign body, right foot, initial encounter: Secondary | ICD-10-CM

## 2013-01-05 DIAGNOSIS — G4700 Insomnia, unspecified: Secondary | ICD-10-CM | POA: Insufficient documentation

## 2013-01-05 DIAGNOSIS — F329 Major depressive disorder, single episode, unspecified: Secondary | ICD-10-CM | POA: Insufficient documentation

## 2013-01-05 DIAGNOSIS — E039 Hypothyroidism, unspecified: Secondary | ICD-10-CM | POA: Insufficient documentation

## 2013-01-05 HISTORY — DX: Anxiety disorder, unspecified: F41.9

## 2013-01-05 HISTORY — DX: Chronic kidney disease, stage 3 (moderate): N18.3

## 2013-01-05 HISTORY — DX: Depression, unspecified: F32.A

## 2013-01-05 HISTORY — DX: Insomnia, unspecified: G47.00

## 2013-01-05 HISTORY — DX: Type 2 diabetes mellitus without complications: E11.9

## 2013-01-05 HISTORY — DX: Major depressive disorder, single episode, unspecified: F32.9

## 2013-01-05 HISTORY — DX: Dementia in other diseases classified elsewhere, unspecified severity, without behavioral disturbance, psychotic disturbance, mood disturbance, and anxiety: F02.80

## 2013-01-05 HISTORY — DX: Unspecified atrial fibrillation: I48.91

## 2013-01-05 HISTORY — DX: Disorder of kidney and ureter, unspecified: N28.9

## 2013-01-05 HISTORY — DX: Hypothyroidism, unspecified: E03.9

## 2013-01-05 HISTORY — DX: Chronic kidney disease, stage 3 unspecified: N18.30

## 2013-01-05 HISTORY — DX: Alzheimer's disease, unspecified: G30.9

## 2013-01-05 NOTE — ED Notes (Signed)
Family at bedside. 

## 2013-01-05 NOTE — ED Notes (Signed)
Per EMS: pt was sent from [facility] for a laceration to her great toe on her foot. Pt has a laceration that wraps around her great toe. Bleeding controlled without bandage.

## 2013-01-06 ENCOUNTER — Emergency Department (HOSPITAL_BASED_OUTPATIENT_CLINIC_OR_DEPARTMENT_OTHER): Payer: PRIVATE HEALTH INSURANCE

## 2013-01-06 NOTE — ED Notes (Signed)
[facility] at bedside to transport pt back to NH.

## 2013-01-06 NOTE — ED Notes (Signed)
[facility] contacted for transport back to facility

## 2013-01-06 NOTE — ED Provider Notes (Signed)
CSN: [id number]     Arrival date & time 01/05/13  2307 History   First MD Initiated Contact with Patient 01/06/13 0028     Chief Complaint  Patient presents with  . Extremity Laceration   (Consider location/radiation/quality/duration/timing/severity/associated sxs/prior Treatment) Patient is a 79 y.o. female presenting with foot injury. The history is provided by a relative. The history is limited by the condition of the patient. No language interpreter was used.  Foot Injury Location:  Foot Time since incident: today, unknown time. Foot location:  R foot Pain details:    Quality:  Unable to specify   Severity:  Unable to specify   Onset quality:  Unable to specify   Timing:  Unable to specify Chronicity:  New Dislocation: no   Foreign body present:  Unable to specify Prior injury to area:  Unable to specify Relieved by:  Nothing Worsened by:  Nothing tried Ineffective treatments:  None tried   Past Medical History  Diagnosis Date  . Alzheimer disease   . Hypothyroidism   . A-fib   . Diabetes mellitus without complication   . Depression   . Anxiety   . Insomnia   . Renal disorder   . Chronic kidney disease, stage 3    History reviewed. No pertinent past surgical history. History reviewed. No pertinent family history. History  Substance Use Topics  . Smoking status: Never Smoker   . Smokeless tobacco: Not on file  . Alcohol Use: Not on file   OB History   Grav Para Term Preterm Abortions TAB SAB Ect Mult Living                 Review of Systems  Unable to perform ROS: Dementia    Allergies  Review of patient's allergies indicates no known allergies.  Home Medications   Current Outpatient Rx  Name  Route  Sig  Dispense  Refill  . bisacodyl (DULCOLAX) 10 MG suppository   Rectal   Place 10 mg rectally as needed for moderate constipation or severe constipation.         . calcium carbonate (OS-CAL) 600 MG TABS tablet   Oral   Take 600 mg by mouth 2  (two) times daily with a meal.         . digoxin (LANOXIN) 0.125 MG tablet   Oral   Take by mouth every other day.         . divalproex (DEPAKOTE SPRINKLE) 125 MG capsule   Oral   Take 125 mg by mouth every morning.         . escitalopram (LEXAPRO) 20 MG tablet   Oral   Take 20 mg by mouth daily.         Marland Kitchen HYDROcodone-acetaminophen (VICODIN) 5-500 MG per tablet   Oral   Take 0.5 tablets by mouth 2 (two) times daily as needed for pain.         . magnesium hydroxide (MILK OF MAGNESIA) 400 MG/5ML suspension   Oral   Take 30 mLs by mouth daily as needed for mild constipation.         . memantine (NAMENDA) 10 MG tablet   Oral   Take 10 mg by mouth 2 (two) times daily.         . metoprolol tartrate (LOPRESSOR) 25 MG tablet   Oral   Take 25 mg by mouth 2 (two) times daily. Hold HR less than 60         . Multiple  Vitamins-Minerals (MULTIVITAMIN WITH MINERALS) tablet   Oral   Take 1 tablet by mouth daily.         . ranitidine (ZANTAC) 150 MG capsule   Oral   Take 150 mg by mouth 2 (two) times daily.         . temazepam (RESTORIL) 7.5 MG capsule   Oral   Take 7.5 mg by mouth at bedtime as needed for sleep.         Marland Kitchen aspirin 325 MG EC tablet   Oral   Take 325 mg by mouth daily.         . clonazePAM (KLONOPIN) 0.5 MG tablet      Take one tablet by mouth once daily; Take 1/2 tablet by mouth once daily as needed   45 tablet   5    BP 165/70  Pulse 57  Temp(Src) 98.4 F (36.9 C) (Oral)  Resp 16  SpO2 96% Physical Exam  Constitutional: She is oriented to person, place, and time. She appears well-developed and well-nourished. No distress.  HENT:  Head: Normocephalic and atraumatic.  Mouth/Throat: No oropharyngeal exudate.  Eyes: Pupils are equal, round, and reactive to light.  Neck: Normal range of motion. Neck supple.  Cardiovascular: Normal rate, regular rhythm and normal heart sounds.  Exam reveals no gallop and no friction rub.   No murmur  heard. Pulmonary/Chest: Effort normal and breath sounds normal. No respiratory distress. She has no wheezes. She has no rales.  Abdominal: Soft. Bowel sounds are normal. She exhibits no distension and no mass. There is no tenderness. There is no rebound and no guarding.  Musculoskeletal: Normal range of motion. She exhibits no edema and no tenderness.  Several abrasions on BL shins, different stages of healing.  4cm laceration on top of R foot at base of great toe  Neurological: She is alert and oriented to person, place, and time. GCS eye subscore is 4. GCS verbal subscore is 4. GCS motor subscore is 6.  Skin: Skin is warm and dry.  Psychiatric: She has a normal mood and affect.    ED Course  LACERATION REPAIR Date/Time: 01/06/2013 7:16 AM Performed by: Toy Cookey, E Authorized by: Toy Cookey, E Consent: Verbal consent obtained. written consent not obtained. Risks and benefits: risks, benefits and alternatives were discussed Consent given by: son. Patient understanding: patient states understanding of the procedure being performed Patient consent: the patient's understanding of the procedure matches consent given Procedure consent: procedure consent matches procedure scheduled Relevant documents: relevant documents present and verified Test results: test results available and properly labeled Site marked: the operative site was marked Imaging studies: imaging studies available Required items: required blood products, implants, devices, and special equipment available Patient identity confirmed: verbally with patient Time out: Immediately prior to procedure a "time out" was called to verify the correct patient, procedure, equipment, support staff and site/side marked as required. Body area: lower extremity Location details: left foot Laceration length: 4 cm Tendon involvement: none Nerve involvement: none Vascular damage: no Anesthesia: local infiltration Local  anesthetic: lidocaine 1% with epinephrine Anesthetic total: 3 ml Preparation: Patient was prepped and draped in the usual sterile fashion. Irrigation solution: saline Irrigation method: syringe Amount of cleaning: standard Debridement: none Skin closure: 5-0 Prolene Number of sutures: 6 Technique: simple Approximation: close Approximation difficulty: simple Dressing: 4x4 sterile gauze, antibiotic ointment, gauze roll and splint Patient tolerance: Patient tolerated the procedure well with no immediate complications.   (including critical care time) Labs Review  Labs Reviewed - No data to display Imaging Review Dg Foot Complete Left  01/06/2013   CLINICAL DATA:  79 year old female with left great toe injury.  EXAM: LEFT FOOT - COMPLETE 3+ VIEW  COMPARISON:  None.  FINDINGS: A subacute to remote fracture of the 4th toe proximal phalanx is noted.  A tiny bony density along the medial base of the great toe proximal phalanx is noted of uncertain chronicity.  There is no evidence of subluxation or dislocation.  The Lisfranc joints are unremarkable.  No focal bony lesions are identified.  IMPRESSION: Tiny bony density along the medial base of the great toe proximal phalanx - suspect remote but correlate with pain.  Subacute to remote fracture of the 4th toe proximal phalanx.   Electronically Signed   By: Laveda Abbe M.D.   On: 01/06/2013 01:36    EKG Interpretation   None       MDM   1. Foot laceration, right, initial encounter    Pt is a 79 y.o. female with Pmhx as above who presents with lacertion at base of R great toe due to unknown mechanism.  No other complaints per family, and is at baseline mental status.  XR ordered to r/o fracture, FB and had small bony density at base of medial great toe.  Laceration repaired. Pt placed in fracture boot.  Return precautions given for new or worsening symptoms including s/sx infection. Recommend suture removal in 10-14 days.          Shanna Cisco, MD 01/06/13 [phone]

## 2013-01-06 NOTE — ED Notes (Signed)
[facility] called and told that patient will be coming back to facility and had stiches placed.

## 2013-01-06 NOTE — ED Notes (Signed)
Cleaned patient laceration with NS, pt great toe on left foot has laceration that extends between her toe and second toe. Pt family states that patient uses wheelchair and does not walk anymore. Pt had on socks with no blood on socks this afternoon when family visited. Pt NH sent her this evening for the laceration but unknown time of when the laceration occurred. EDP at bedside as well to evaul. pt laceration.

## 2013-01-06 NOTE — ED Notes (Addendum)
EDP at bedside suturing patient toe, 7 stitches placed. Pt family leaving from bedside. Non-adhesive dressing applied. Bleeding controlled. Pt checked and remains dry.

## 2013-01-15 ENCOUNTER — Non-Acute Institutional Stay (SKILLED_NURSING_FACILITY): Payer: PRIVATE HEALTH INSURANCE | Admitting: Internal Medicine

## 2013-01-15 DIAGNOSIS — I4891 Unspecified atrial fibrillation: Secondary | ICD-10-CM

## 2013-01-15 DIAGNOSIS — N183 Chronic kidney disease, stage 3 unspecified: Secondary | ICD-10-CM

## 2013-01-15 DIAGNOSIS — F0280 Dementia in other diseases classified elsewhere without behavioral disturbance: Secondary | ICD-10-CM

## 2013-01-15 DIAGNOSIS — E039 Hypothyroidism, unspecified: Secondary | ICD-10-CM

## 2013-01-19 ENCOUNTER — Encounter: Payer: Self-pay | Admitting: Internal Medicine

## 2013-01-19 NOTE — Progress Notes (Signed)
PROGRESS NOTE  DATE: 01-15-13  FACILITY: Nursing Home Location: [facility]  LEVEL OF CARE: SNF (31)  Routine Visit  CHIEF COMPLAINT:  Manage Alzheimer's dementia, atrial fibrillation and hypothyroidism  HISTORY OF PRESENT ILLNESS:  REASSESSMENT OF ONGOING PROBLEM(S):  ATRIAL FIBRILLATION: the patients atrial fibrillation remains stable.  The staff deny DOE, tachycardia, orthopnea, transient neurological sx, pedal edema, palpitations, & PNDs.  No complications noted from the medications currently being used.  DEMENTIA: The dementia remaines stable and continues to function adequately in the current living environment with supervision.  The patient has had little changes in behavior. No complications noted from the medications presently being used. Advanced. Patient does not follow commands.  HYPOTHYROIDISM: The hypothyroidism remains stable. No complications noted from the medications presently being used.  The patient denies fatigue or constipation.  Last TSH 4.64 in 6/14, in 7-14 TSH 1.32, in 10/14 TSH 5.289, in 11- 14 TSH 1.14  PAST MEDICAL HISTORY : Reviewed.  No changes.  CURRENT MEDICATIONS: Reviewed per [facility]  REVIEW OF SYSTEMS: Unobtainable due to dementia.  PHYSICAL EXAMINATION  VS:  T 97.6     P 56     RR 19    BP 120/74    POX %     WT (Lb) 117.6  GENERAL: no acute distress, normal body habitus EYES: conjunctivae normal, sclerae normal, normal eye lids NECK: supple, trachea midline, no neck masses, no thyroid tenderness, no thyromegaly LYMPHATICS: no LAN in the neck, no supraclavicular LAN RESPIRATORY: breathing is even & unlabored, BS CTAB CARDIAC: RRR, no murmur,no extra heart sounds, no edema GI: abdomen soft, normal BS, no masses, no tenderness, no hepatomegaly, no splenomegaly PSYCHIATRIC: the patient is alert & disoriented, affect & behavior appropriate  LABS/RADIOLOGY:  10-14 CO2 13, glucose 106, BUN 24, creatinine 1.37  otherwise BMP normal, digoxin 0.4, CBC normal 9-14 hemoglobin A1c 6  6/14 digoxin 0.7, CBC normal, glucose 118, BUN 27, creatinine 1.18 otherwise BMP normal 5/14 glucose 118, BUN 29, creatinine 1.7 otherwise CMP normal 3/14 hemoglobin A1c 6.2  ASSESSMENT/PLAN:  Alzheimer's dementia-advanced.  Atrial fibrillation-rate controlled. Hypothyroidism-well controlled Chronic kidney disease stage III- stable. Diabetes mellitus-diet controlled.  Depression-stable. Insomnia-on restoril Check liver profile  CPT CODE: 16109

## 2013-02-26 ENCOUNTER — Non-Acute Institutional Stay (SKILLED_NURSING_FACILITY): Payer: PRIVATE HEALTH INSURANCE | Admitting: Internal Medicine

## 2013-02-26 DIAGNOSIS — G309 Alzheimer's disease, unspecified: Principal | ICD-10-CM

## 2013-02-26 DIAGNOSIS — N183 Chronic kidney disease, stage 3 unspecified: Secondary | ICD-10-CM

## 2013-02-26 DIAGNOSIS — F0280 Dementia in other diseases classified elsewhere without behavioral disturbance: Secondary | ICD-10-CM

## 2013-02-26 DIAGNOSIS — E039 Hypothyroidism, unspecified: Secondary | ICD-10-CM

## 2013-02-26 DIAGNOSIS — I4891 Unspecified atrial fibrillation: Secondary | ICD-10-CM

## 2013-03-02 NOTE — Progress Notes (Signed)
         PROGRESS NOTE  DATE: 02-26-13  FACILITY: Nursing Home Location: [facility]  LEVEL OF CARE: SNF (31)  Routine Visit  CHIEF COMPLAINT:  Manage Alzheimer's dementia, atrial fibrillation and hypothyroidism  HISTORY OF PRESENT ILLNESS:  REASSESSMENT OF ONGOING PROBLEM(S):  ATRIAL FIBRILLATION: the patients atrial fibrillation remains stable.  The staff deny DOE, tachycardia, orthopnea, transient neurological sx, pedal edema, palpitations, & PNDs.  No complications noted from the medications currently being used.  DEMENTIA: The dementia remaines stable and continues to function adequately in the current living environment with supervision.  The patient has had little changes in behavior. No complications noted from the medications presently being used. Advanced. Patient does not follow commands.  HYPOTHYROIDISM: The hypothyroidism remains stable. No complications noted from the medications presently being used.  The patient denies fatigue or constipation.  Last TSH 4.64 in 6/14, in 7-14 TSH 1.32, in 10/14 TSH 5.289, in 11- 14 TSH 1.14, in 12-14 TSH 3.098.  PAST MEDICAL HISTORY : Reviewed.  No changes.  CURRENT MEDICATIONS: Reviewed per [facility]  REVIEW OF SYSTEMS: Unobtainable due to dementia.  PHYSICAL EXAMINATION  VS:  T 97     P 56     RR 17    BP 106/68    POX %     WT (Lb) 118.6  GENERAL: no acute distress, normal body habitus EYES: conjunctivae normal, sclerae normal, normal eye lids NECK: supple, trachea midline, no neck masses, no thyroid tenderness, no thyromegaly LYMPHATICS: no LAN in the neck, no supraclavicular LAN RESPIRATORY: breathing is even & unlabored, BS CTAB CARDIAC: RRR, no murmur,no extra heart sounds, no edema GI: abdomen soft, normal BS, no masses, no tenderness, no hepatomegaly, no splenomegaly PSYCHIATRIC: the patient is alert & disoriented, affect & behavior appropriate  LABS/RADIOLOGY:  12-14 liver profile normal, BUN 41  otherwise BMP normal, MCV 100. 7 otherwise CBC normal, Depakote level 16.4 10-14 CO2 13, glucose 106, BUN 24, creatinine 1.37 otherwise BMP normal, digoxin 0.4, CBC normal 9-14 hemoglobin A1c 6  6/14 digoxin 0.7, CBC normal, glucose 118, BUN 27, creatinine 1.18 otherwise BMP normal 5/14 glucose 118, BUN 29, creatinine 1.7 otherwise CMP normal 3/14 hemoglobin A1c 6.2  ASSESSMENT/PLAN:  Alzheimer's dementia-advanced.  Atrial fibrillation-rate controlled. Hypothyroidism-well controlled Chronic kidney disease stage III- stable. Diabetes mellitus-diet controlled.  Depression-stable. Insomnia-on restoril Macrocytosis-check RBC folate and vitamin B12 level  CPT CODE: 17001

## 2013-03-05 ENCOUNTER — Encounter (HOSPITAL_COMMUNITY): Payer: Self-pay | Admitting: Emergency Medicine

## 2013-03-05 ENCOUNTER — Emergency Department (HOSPITAL_COMMUNITY): Payer: PRIVATE HEALTH INSURANCE

## 2013-03-05 ENCOUNTER — Emergency Department (HOSPITAL_COMMUNITY)
Admission: EM | Admit: 2013-03-05 | Discharge: 2013-03-05 | Disposition: A | Discharge status: Skilled Nursing Facility | Payer: PRIVATE HEALTH INSURANCE | Attending: Emergency Medicine | Admitting: Emergency Medicine

## 2013-03-05 DIAGNOSIS — Y9210 Unspecified residential institution as the place of occurrence of the external cause: Secondary | ICD-10-CM | POA: Insufficient documentation

## 2013-03-05 DIAGNOSIS — F411 Generalized anxiety disorder: Secondary | ICD-10-CM | POA: Insufficient documentation

## 2013-03-05 DIAGNOSIS — N183 Chronic kidney disease, stage 3 unspecified: Secondary | ICD-10-CM | POA: Insufficient documentation

## 2013-03-05 DIAGNOSIS — F0280 Dementia in other diseases classified elsewhere without behavioral disturbance: Secondary | ICD-10-CM | POA: Insufficient documentation

## 2013-03-05 DIAGNOSIS — F329 Major depressive disorder, single episode, unspecified: Secondary | ICD-10-CM | POA: Insufficient documentation

## 2013-03-05 DIAGNOSIS — Z23 Encounter for immunization: Secondary | ICD-10-CM | POA: Insufficient documentation

## 2013-03-05 DIAGNOSIS — Z79899 Other long term (current) drug therapy: Secondary | ICD-10-CM | POA: Insufficient documentation

## 2013-03-05 DIAGNOSIS — F3289 Other specified depressive episodes: Secondary | ICD-10-CM | POA: Insufficient documentation

## 2013-03-05 DIAGNOSIS — W050XXA Fall from non-moving wheelchair, initial encounter: Secondary | ICD-10-CM | POA: Insufficient documentation

## 2013-03-05 DIAGNOSIS — Z7982 Long term (current) use of aspirin: Secondary | ICD-10-CM | POA: Insufficient documentation

## 2013-03-05 DIAGNOSIS — G4700 Insomnia, unspecified: Secondary | ICD-10-CM | POA: Insufficient documentation

## 2013-03-05 DIAGNOSIS — S0180XA Unspecified open wound of other part of head, initial encounter: Secondary | ICD-10-CM | POA: Insufficient documentation

## 2013-03-05 DIAGNOSIS — S0003XA Contusion of scalp, initial encounter: Secondary | ICD-10-CM | POA: Insufficient documentation

## 2013-03-05 DIAGNOSIS — W19XXXA Unspecified fall, initial encounter: Secondary | ICD-10-CM

## 2013-03-05 DIAGNOSIS — E119 Type 2 diabetes mellitus without complications: Secondary | ICD-10-CM | POA: Insufficient documentation

## 2013-03-05 DIAGNOSIS — Y9389 Activity, other specified: Secondary | ICD-10-CM | POA: Insufficient documentation

## 2013-03-05 DIAGNOSIS — E039 Hypothyroidism, unspecified: Secondary | ICD-10-CM | POA: Insufficient documentation

## 2013-03-05 DIAGNOSIS — G309 Alzheimer's disease, unspecified: Secondary | ICD-10-CM | POA: Insufficient documentation

## 2013-03-05 DIAGNOSIS — S1093XA Contusion of unspecified part of neck, initial encounter: Principal | ICD-10-CM

## 2013-03-05 DIAGNOSIS — S0083XA Contusion of other part of head, initial encounter: Principal | ICD-10-CM

## 2013-03-05 MED ORDER — TETANUS-DIPHTH-ACELL PERTUSSIS 5-2.5-18.5 LF-MCG/0.5 IM SUSP
0.5000 mL | Freq: Once | INTRAMUSCULAR | Status: AC
  Administered 2013-03-05: 0.5 mL via INTRAMUSCULAR
  Filled 2013-03-05: qty 0.5

## 2013-03-05 NOTE — ED Notes (Signed)
Verbal report called to Randell Patient, Therapist, sports at [facility]. Pt awaiting transport at this time.

## 2013-03-05 NOTE — ED Provider Notes (Signed)
LACERATION REPAIR Date/Time: 03/05/2013 11:51 AM Performed by: Vernie Murders K Authorized by: Lucila [place] Consent: Verbal consent obtained. Patient identity confirmed: arm band Body area: head/neck Location details: forehead Laceration length: 3 cm Foreign bodies: no foreign bodies Tendon involvement: none Nerve involvement: none Vascular damage: no Anesthesia: local infiltration Local anesthetic: lidocaine 2% without epinephrine Anesthetic total: 3 ml Patient sedated: no Preparation: Patient was prepped and draped in the usual sterile fashion. Irrigation solution: saline Irrigation method: syringe Amount of cleaning: standard Debridement: none Degree of undermining: none Skin closure: Ethilon Number of sutures: 4 Technique: simple Approximation: close Approximation difficulty: simple Dressing: antibiotic ointment Patient tolerance: Patient tolerated the procedure well with no immediate complications.   Tetanus booster given in ED.  Patient's son in room.      [name] Deatra Mcmahen PA-C          Lucila [place], PA-C 03/05/13 1153

## 2013-03-05 NOTE — Discharge Instructions (Signed)
Sutured Wound Care Sutures to come out in 5 days.  Sutures are stitches that can be used to close wounds. Wound care helps prevent pain and infection.  HOME CARE INSTRUCTIONS   Rest and elevate the injured area until all the pain and swelling are gone.  Only take over-the-counter or prescription medicines for pain, discomfort, or fever as directed by your caregiver.  After 48 hours, gently wash the area with mild soap and water once a day, or as directed. Rinse off the soap. Pat the area dry with a clean towel. Do not rub the wound. This may cause bleeding.  Follow your caregiver's instructions for how often to change the bandage (dressing). Stop using a dressing after 2 days or after the wound stops draining.  If the dressing sticks, moisten it with soapy water and gently remove it.  Apply ointment on the wound as directed.  Avoid stretching a sutured wound.  Drink enough fluids to keep your urine clear or pale yellow.  Follow up with your caregiver for suture removal as directed.  Use sunscreen on your wound for the next 3 to 6 months so the scar will not darken. SEEK IMMEDIATE MEDICAL CARE IF:   Your wound becomes red, swollen, hot, or tender.  You have increasing pain in the wound.  You have a red streak that extends from the wound.  There is pus coming from the wound.  You have a fever.  You have shaking chills.  There is a bad smell coming from the wound.  You have persistent bleeding from the wound. MAKE SURE YOU:   Understand these instructions.  Will watch your condition.  Will get help right away if you are not doing well or get worse. Document Released: 03/04/2004 Document Revised: 04/19/2011 Document Reviewed: 05/31/2010 [facility] Patient Information 2014 [place], [place].

## 2013-03-05 NOTE — ED Provider Notes (Signed)
CSN: [id number]     Arrival date & time 03/05/13  0815 History   First MD Initiated Contact with Patient 03/05/13 [phone]     Chief Complaint  Patient presents with  . Fall  . Head Injury   level V caveat dementia history is obtained from Natasha Mead via telephone who is a Marine scientist at [employer] from living and rehab (Consider location/radiation/quality/duration/timing/severity/associated sxs/prior Treatment) HPI Patient fell out of her wheelchair 7:10 AM today striking her head. No other injury. She suffered a laceration to her forehead and hematoma to her right face as a result fall. Patient is nonverbal. Does not offer any specific complaint. Past Medical History  Diagnosis Date  . Alzheimer disease   . Hypothyroidism   . A-fib   . Diabetes mellitus without complication   . Depression   . Anxiety   . Insomnia   . Renal disorder   . Chronic kidney disease, stage 3    History reviewed. No pertinent past surgical history. History reviewed. No pertinent family history. History  Substance Use Topics  . Smoking status: Never Smoker   . Smokeless tobacco: Never Used  . Alcohol Use: No   OB History   Grav Para Term Preterm Abortions TAB SAB Ect Mult Living                 Review of Systems  Unable to perform ROS: Dementia  Skin: Positive for wound.       Fore head laceration    Allergies  Review of patient's allergies indicates no known allergies.  Home Medications   Current Outpatient Rx  Name  Route  Sig  Dispense  Refill  . aspirin 325 MG EC tablet   Oral   Take 325 mg by mouth daily.         . bisacodyl (DULCOLAX) 10 MG suppository   Rectal   Place 10 mg rectally as needed for moderate constipation or severe constipation.         . calcium carbonate (OS-CAL) 600 MG TABS tablet   Oral   Take 600 mg by mouth 2 (two) times daily with a meal.         . clonazePAM (KLONOPIN) 0.5 MG tablet      Take one tablet by mouth once daily; Take 1/2 tablet by mouth once  daily as needed   45 tablet   5   . digoxin (LANOXIN) 0.125 MG tablet   Oral   Take by mouth every other day.         . divalproex (DEPAKOTE SPRINKLE) 125 MG capsule   Oral   Take 125 mg by mouth every morning.         . escitalopram (LEXAPRO) 20 MG tablet   Oral   Take 20 mg by mouth daily.         Marland Kitchen HYDROcodone-acetaminophen (VICODIN) 5-500 MG per tablet   Oral   Take 0.5 tablets by mouth 2 (two) times daily as needed for pain.         . magnesium hydroxide (MILK OF MAGNESIA) 400 MG/5ML suspension   Oral   Take 30 mLs by mouth daily as needed for mild constipation.         . memantine (NAMENDA) 10 MG tablet   Oral   Take 10 mg by mouth 2 (two) times daily.         . metoprolol tartrate (LOPRESSOR) 25 MG tablet   Oral   Take 25 mg by  mouth 2 (two) times daily. Hold HR less than 60         . Multiple Vitamins-Minerals (MULTIVITAMIN WITH MINERALS) tablet   Oral   Take 1 tablet by mouth daily.         . ranitidine (ZANTAC) 150 MG capsule   Oral   Take 150 mg by mouth 2 (two) times daily.         . temazepam (RESTORIL) 7.5 MG capsule   Oral   Take 7.5 mg by mouth at bedtime as needed for sleep.          There were no vitals taken for this visit. Physical Exam  Nursing note and vitals reviewed. Constitutional: She appears well-developed.  Chronically ill-appearing  HENT:  3 cm jagged laceration at fore head at hairline. No active bleeding. Hematoma to right zygomatic area bilateral tympanic membranes normal  Eyes: Conjunctivae and EOM are normal. Left eye exhibits no discharge.  Neck: Neck supple. No tracheal deviation present. No thyromegaly present.  No point tenderness  Cardiovascular: Normal rate.   No murmur heard. Irregularly irregular  Pulmonary/Chest: Effort normal and breath sounds normal.  Abdominal: Soft. Bowel sounds are normal. She exhibits no distension. There is no tenderness.  Musculoskeletal: Normal range of motion. She  exhibits no edema and no tenderness.  Entire spine nontender. Pelvis stable nontender.  Neurological: She is alert. Coordination normal.  Does not follow simple commands. Nonverbal. Moves all extremities.  Skin: Skin is warm and dry. No rash noted.  Dime size abrasion right knee  Psychiatric: She has a normal mood and affect.   Results for orders placed during the hospital encounter of 08/06/07  URINE CULTURE      Result Value Range   Specimen Description URINE, CATHETERIZED     Special Requests CX ADDED AT 0251     Colony Count >=100,000 COLONIES/ML     Culture       Value: ESCHERICHIA COLI     Note: Confirmed Extended Spectrum Beta-Lactamase Producer (ESBL)   Report Status 08/08/2007 FINAL     Organism ID, Bacteria ESCHERICHIA COLI    CULTURE, BLOOD (ROUTINE X 2)      Result Value Range   Specimen Description BLOOD RIGHT ARM     Special Requests       Value: BOTTLES DRAWN AEROBIC AND ANAEROBIC [place] AEROBIC 3CC ANAEROBIC   Culture NO GROWTH 5 DAYS     Report Status 08/12/2007 FINAL    CULTURE, BLOOD (ROUTINE X 2)      Result Value Range   Specimen Description BLOOD RIGHT ARM     Special Requests       Value: BOTTLES DRAWN AEROBIC AND ANAEROBIC 10CC AEROBIC 6CC ANAEROBIC   Culture NO GROWTH 5 DAYS     Report Status 08/12/2007 FINAL    BASIC METABOLIC PANEL      Result Value Range   Sodium 140     Potassium 3.7     Chloride 103     CO2 21     Glucose, Bld 122 (*)    BUN 51 (*)    Creatinine, Ser 3.33 (*)    Calcium 9.6     GFR calc non Af Amer 13 (*)    GFR calc Af Amer   (*)    Value: 16            The eGFR has been calculated     using the MDRD equation.     This calculation has  not been     validated in all clinical  CBC      Result Value Range   WBC 26.7 (*)    RBC 4.81     Hemoglobin 15.3 (*)    HCT 45.6     MCV 94.8     MCHC 33.5     RDW 15.9 (*)    Platelets 201    D-DIMER, QUANTITATIVE      Result Value Range   D-Dimer, Quant   (*)    Value: 8.82             AT THE INHOUSE ESTABLISHED CUTOFF     VALUE OF 0.48 ug/mL FEU,     THIS ASSAY HAS BEEN DOCUMENTED     IN THE LITERATURE TO HAVE  URINALYSIS, ROUTINE W REFLEX MICROSCOPIC      Result Value Range   Color, Urine AMBER BIOCHEMICALS MAY BE AFFECTED BY COLOR (*)    APPearance CLOUDY (*)    Specific Gravity, Urine 1.025     pH 5.0     Glucose, UA NEGATIVE     Hgb urine dipstick TRACE (*)    Bilirubin Urine SMALL (*)    Ketones, ur 15 (*)    Protein, ur NEGATIVE     Urobilinogen, UA 1.0     Nitrite NEGATIVE     Leukocytes, UA LARGE (*)   URINE MICROSCOPIC-ADD ON      Result Value Range   Squamous Epithelial / LPF RARE     WBC, UA 21-50     RBC / HPF 3-6     Bacteria, UA MANY (*)   CBC      Result Value Range   WBC 25.2 (*)    RBC 4.21     Hemoglobin 13.7     HCT 40.0     MCV 94.9     MCHC 34.3     RDW 15.8 (*)    Platelets 190    CK TOTAL AND CKMB      Result Value Range   Total CK 74     CK, MB 3.5     Relative Index       Value: RELATIVE INDEX IS INVALID     WHEN CK < 100 U/L             COMPREHENSIVE METABOLIC PANEL      Result Value Range   Sodium 142     Potassium 3.8     Chloride 106     CO2 22     Glucose, Bld 106 (*)    BUN 52 (*)    Creatinine, Ser 2.96 (*)    Calcium 9.2     Total Protein 6.5     Albumin 2.5 (*)    AST 40 (*)    ALT 29     Alkaline Phosphatase 65     Total Bilirubin 0.6     GFR calc non Af Amer 15 (*)    GFR calc Af Amer   (*)    Value: 18            The eGFR has been calculated     using the MDRD equation.     This calculation has not been     validated in all clinical  DIFFERENTIAL      Result Value Range   Neutrophils Relative % 95 (*)    Lymphocytes Relative 5 (*)    Monocytes Relative 0 (*)  Eosinophils Relative 0     Basophils Relative 0     Neutro Abs 23.9 (*)    Lymphs Abs 1.3     Monocytes Absolute 0.0 (*)    Eosinophils Absolute 0.0     Basophils Absolute 0.0     WBC Morphology TOXIC GRANULATION     PROTIME-INR      Result Value Range   Prothrombin Time 19.3 (*)    INR 1.6 (*)   TROPONIN I      Result Value Range   Troponin I       Value: 0.06            PERSISTENTLY INCREASED TROPONIN     VALUES IN THE RANGE OF 0.06-0.49     ng/mL CAN BE SEEN IN:           -UNSTABLE ANGINA  B-NATRIURETIC PEPTIDE (CONVERTED LAB)      Result Value Range   Pro B Natriuretic peptide (BNP) 232.0 (*)   CK TOTAL AND CKMB      Result Value Range   Total CK 103     CK, MB 2.6     Relative Index 2.5    TROPONIN I      Result Value Range   Troponin I       Value: 0.05            NO INDICATION OF     MYOCARDIAL INJURY.  CK TOTAL AND CKMB      Result Value Range   Total CK 144     CK, MB 2.2     Relative Index 1.5    TROPONIN I      Result Value Range   Troponin I   (*)    Value: 0.09            PERSISTENTLY INCREASED TROPONIN     VALUES IN THE RANGE OF 0.06-0.49     ng/mL CAN BE SEEN IN:           -UNSTABLE ANGINA  BASIC METABOLIC PANEL      Result Value Range   Sodium 146 (*)    Potassium 3.5     Chloride 111     CO2 19     Glucose, Bld 75     BUN 51 (*)    Creatinine, Ser 1.93 DELTA CHECK NOTED (*)    Calcium 8.0 (*)    GFR calc non Af Amer 25 (*)    GFR calc Af Amer   (*)    Value: 30            The eGFR has been calculated     using the MDRD equation.     This calculation has not been     validated in all clinical  CBC      Result Value Range   WBC 20.0 (*)    RBC 3.77 (*)    Hemoglobin 11.9 (*)    HCT 35.3 (*)    MCV 93.5     MCHC 33.8     RDW 16.2 (*)    Platelets 188    DIFFERENTIAL      Result Value Range   Neutrophils Relative % 92 (*)    Lymphocytes Relative 7 (*)    Monocytes Relative 1 (*)    Eosinophils Relative 0     Basophils Relative 0     Neutro Abs 18.4 (*)    Lymphs Abs 1.4     Monocytes  Absolute 0.2     Eosinophils Absolute 0.0     Basophils Absolute 0.0     RBC Morphology CRENATED RBCs     WBC Morphology TOXIC GRANULATION    VANCOMYCIN,  TROUGH      Result Value Range   Vancomycin Tr <5.0 (*)   CBC      Result Value Range   WBC 27.7 (*)    RBC 5.01     Hemoglobin 16.1 (*)    HCT 46.7 (*)    MCV 93.2     MCHC 34.4     RDW 15.9 (*)    Platelets 142 (*)   CBC      Result Value Range   WBC SPECIMEN CLOTTED     RBC 5.38 SPECIMEN CLOTTED DISREGARD FOLLOWING RESULTS (*)    Hemoglobin 17.5 SPECIMEN CLOTTED (*)    HCT 50.6 SPECIMEN CLOTTED (*)    MCV 94.0 SPECIMEN CLOTTED     MCHC 34.5 SPECIMEN CLOTTED     RDW 16.4 SPECIMEN CLOTTED (*)    Platelets SPECIMEN CLOTTED    BASIC METABOLIC PANEL      Result Value Range   Sodium 155 (*)    Potassium 3.3 (*)    Chloride 125 (*)    CO2 20     Glucose, Bld 116 (*)    BUN 66 (*)    Creatinine, Ser 1.59 (*)    Calcium 8.5     GFR calc non Af Amer 31 (*)    GFR calc Af Amer   (*)    Value: 37            The eGFR has been calculated     using the MDRD equation.     This calculation has not been     validated in all clinical  CBC      Result Value Range   WBC 27.6 (*)    RBC 4.33     Hemoglobin 13.6 DELTA CHECK NOTED REPEATED TO VERIFY     HCT 40.6     MCV 93.7     MCHC 33.4     RDW 16.1 (*)    Platelets 540    BASIC METABOLIC PANEL      Result Value Range   Sodium 159 (*)    Potassium 3.2 (*)    Chloride >130 (*)    CO2 19     Glucose, Bld 114 (*)    BUN 59 (*)    Creatinine, Ser 1.21 (*)    Calcium 8.5     GFR calc non Af Amer 42 (*)    GFR calc Af Amer   (*)    Value: 51            The eGFR has been calculated     using the MDRD equation.     This calculation has not been     validated in all clinical  CBC      Result Value Range   WBC 19.7 (*)    RBC 3.81 (*)    Hemoglobin 12.3     HCT 36.1     MCV 94.7     MCHC 34.1     RDW 16.4 (*)    Platelets 981    BASIC METABOLIC PANEL      Result Value Range   Sodium   (*)    Value: 162 REPEATED TO VERIFY CRITICAL RESULT CALLED TO, READ BACK BY AND VERIFIED WITH: TURNERLRN [id number]  [id number] MCCAULEG    Potassium 3.5     Chloride   (*)    Value: >130 REPEATED TO VERIFY CRITICAL RESULT CALLED TO, READ BACK BY AND VERIFIED WITH: TRUNERLTN [id number] MCCAULEG   CO2 20     Glucose, Bld 111 (*)    BUN 58 (*)    Creatinine, Ser 1.15     Calcium 8.5     GFR calc non Af Amer 45 (*)    GFR calc Af Amer   (*)    Value: 54            The eGFR has been calculated     using the MDRD equation.     This calculation has not been     validated in all clinical  BASIC METABOLIC PANEL      Result Value Range   Sodium 158 (*)    Potassium 3.4 (*)    Chloride >130 (*)    CO2 20     Glucose, Bld 143 (*)    BUN 57 (*)    Creatinine, Ser 1.20     Calcium 8.5     GFR calc non Af Amer 43 (*)    GFR calc Af Amer   (*)    Value: 52            The eGFR has been calculated     using the MDRD equation.     This calculation has not been     validated in all clinical  SODIUM, URINE, RANDOM      Result Value Range   Sodium, Ur 77    OSMOLALITY, URINE      Result Value Range   Osmolality, Ur 088    BASIC METABOLIC PANEL      Result Value Range   Sodium 151 (*)    Potassium 3.5     Chloride 124 (*)    CO2 23     Glucose, Bld 134 (*)    BUN 40 (*)    Creatinine, Ser 1.06     Calcium 7.8 (*)    GFR calc non Af Amer 49 (*)    GFR calc Af Amer   (*)    Value: 60            The eGFR has been calculated     using the MDRD equation.     This calculation has not been     validated in all clinical  BASIC METABOLIC PANEL      Result Value Range   Sodium 146 (*)    Potassium 3.4 (*)    Chloride 117 (*)    CO2 23     Glucose, Bld 120 (*)    BUN 30 (*)    Creatinine, Ser 0.96     Calcium 7.9 (*)    GFR calc non Af Amer 55 (*)    GFR calc Af Amer       Value: >60            The eGFR has been calculated     using the MDRD equation.     This calculation has not been     validated in all clinical  CBC      Result Value Range   WBC 14.7 (*)    RBC 3.58 (*)    Hemoglobin 11.3 (*)    HCT 34.1  (*)    MCV 95.4     MCHC 33.0  RDW 16.6 (*)    Platelets 782    BASIC METABOLIC PANEL      Result Value Range   Sodium 143     Potassium 3.1 (*)    Chloride 112     CO2 25     Glucose, Bld 94     BUN 22     Creatinine, Ser 0.92     Calcium 8.0 (*)    GFR calc non Af Amer 58 (*)    GFR calc Af Amer       Value: >60            The eGFR has been calculated     using the MDRD equation.     This calculation has not been     validated in all clinical  CBC      Result Value Range   WBC 9.5     RBC 3.34 (*)    Hemoglobin 10.7 (*)    HCT 31.8 (*)    MCV 95.2     MCHC 33.6     RDW 17.5 (*)    Platelets 956    BASIC METABOLIC PANEL      Result Value Range   Sodium 139     Potassium 4.2     Chloride 108     CO2 23     Glucose, Bld 85     BUN 26 (*)    Creatinine, Ser 1.11     Calcium 8.6     GFR calc non Af Amer 47 (*)    GFR calc Af Amer   (*)    Value: 57            The eGFR has been calculated     using the MDRD equation.     This calculation has not been     validated in all clinical  CBC      Result Value Range   WBC 7.7     RBC 3.61 (*)    Hemoglobin 11.6 (*)    HCT 34.5 (*)    MCV 95.4     MCHC 33.7     RDW 18.3 (*)    Platelets 299     Ct Head Wo Contrast  03/05/2013   CLINICAL DATA:  Fall, head injury  EXAM: CT HEAD WITHOUT CONTRAST  CT MAXILLOFACIAL WITHOUT CONTRAST  CT CERVICAL SPINE WITHOUT CONTRAST  TECHNIQUE: Multidetector CT imaging of the head, cervical spine, and maxillofacial structures were performed using the standard protocol without intravenous contrast. Multiplanar CT image reconstructions of the cervical spine and maxillofacial structures were also generated.  COMPARISON:  CT HEAD W/O CM dated 08/28/2007; CT HEAD W/O CM dated 04/21/2007; CT HEAD W/O CM dated 06/15/2006; CT HEAD W/O CM dated 10/23/2003  FINDINGS: CT HEAD FINDINGS  There is a large superficial hematoma involving the preseptal space on the right and extending superficial to the  right zygomatic arch. No intracranial hemorrhage. No parenchymal contusion. No midline shift or mass effect. Basilar cisterns are patent. No skull base fracture. No fluid in the paranasal sinuses or mastoid air cells.  There is extensive cortical atrophy and periventricular and subcortical white matter hypodensities.  CT MAXILLOFACIAL FINDINGS  Soft tissue swelling over the right preseptal space and zygomatic arch with subcutaneous hematoma. The hematoma measures 15 mm in depth. No associated fracture of the right orbit. The zygomatic arch on the right is intact. The intraconal contents on the right are normal. The globe is normal.  The left orbit is normal. The left zygomatic arch is normal.  There is significant motion degradation of the inferior aspect of the images with phase. This results in the [place] artifacts through the right mandible body. Cannot exclude a right mandibular fracture. The mandibular condyles are intact the located. The exam is motion degraded. No evidence of maxillary bone fracture. Pterygoid plates are normal. No nasal bone fracture.  CT CERVICAL SPINE FINDINGS  There is scoliosis of this cervical spine. There is mild retrolisthesis of C3 on C4 which is likely degenerative. There is mild anterolisthesis of C6 on C7 which likely degenerative. No acute loss of vertebral body height. Normal facet articulation. Normal craniocervical junction. No evidence of epidural paraspinal hematoma.  There is a fracture through the transverse process of the C3 vertebral body on the left. This fracture is through the vertebral artery canal. The fracture appears fairly well corticated and is less indeterminate in age.  IMPRESSION: 1. Scalp hematoma involving the right preseptal space and extending along the right zygomatic arch. No associated fracture of the orbit or injury to the globe or intraconal contents. 2. No intracranial trauma. 3. Extensive microvascular disease and atrophy. 4. Severe motion degradation  through the inferior face. Cannot exclude a fracture of the right mandible with significant step-off artifact. Favor motion artifact. Recommend clinical correlation. 5. There is a nondisplaced fracture of through the left transverse process at C3 which traverses the vertebral artery canal. This fracture is age indeterminate. 6. No additional evidence of cervical spine fracture. There is multilevel disc osteophytic disease with subluxations which are felt to be degenerative. Findings conveyed toSAM Quinley Nesler on 03/05/2013  at09:50.   Electronically Signed   By: Genevive Bi M.D.   On: 03/05/2013 09:51   Ct Cervical Spine Wo Contrast  03/05/2013   CLINICAL DATA:  Fall, head injury  EXAM: CT HEAD WITHOUT CONTRAST  CT MAXILLOFACIAL WITHOUT CONTRAST  CT CERVICAL SPINE WITHOUT CONTRAST  TECHNIQUE: Multidetector CT imaging of the head, cervical spine, and maxillofacial structures were performed using the standard protocol without intravenous contrast. Multiplanar CT image reconstructions of the cervical spine and maxillofacial structures were also generated.  COMPARISON:  CT HEAD W/O CM dated 08/28/2007; CT HEAD W/O CM dated 04/21/2007; CT HEAD W/O CM dated 06/15/2006; CT HEAD W/O CM dated 10/23/2003  FINDINGS: CT HEAD FINDINGS  There is a large superficial hematoma involving the preseptal space on the right and extending superficial to the right zygomatic arch. No intracranial hemorrhage. No parenchymal contusion. No midline shift or mass effect. Basilar cisterns are patent. No skull base fracture. No fluid in the paranasal sinuses or mastoid air cells.  There is extensive cortical atrophy and periventricular and subcortical white matter hypodensities.  CT MAXILLOFACIAL FINDINGS  Soft tissue swelling over the right preseptal space and zygomatic arch with subcutaneous hematoma. The hematoma measures 15 mm in depth. No associated fracture of the right orbit. The zygomatic arch on the right is intact. The intraconal  contents on the right are normal. The globe is normal. The left orbit is normal. The left zygomatic arch is normal.  There is significant motion degradation of the inferior aspect of the images with phase. This results in the [place] artifacts through the right mandible body. Cannot exclude a right mandibular fracture. The mandibular condyles are intact the located. The exam is motion degraded. No evidence of maxillary bone fracture. Pterygoid plates are normal. No nasal bone fracture.  CT CERVICAL SPINE FINDINGS  There is scoliosis of  this cervical spine. There is mild retrolisthesis of C3 on C4 which is likely degenerative. There is mild anterolisthesis of C6 on C7 which likely degenerative. No acute loss of vertebral body height. Normal facet articulation. Normal craniocervical junction. No evidence of epidural paraspinal hematoma.  There is a fracture through the transverse process of the C3 vertebral body on the left. This fracture is through the vertebral artery canal. The fracture appears fairly well corticated and is less indeterminate in age.  IMPRESSION: 1. Scalp hematoma involving the right preseptal space and extending along the right zygomatic arch. No associated fracture of the orbit or injury to the globe or intraconal contents. 2. No intracranial trauma. 3. Extensive microvascular disease and atrophy. 4. Severe motion degradation through the inferior face. Cannot exclude a fracture of the right mandible with significant step-off artifact. Favor motion artifact. Recommend clinical correlation. 5. There is a nondisplaced fracture of through the left transverse process at C3 which traverses the vertebral artery canal. This fracture is age indeterminate. 6. No additional evidence of cervical spine fracture. There is multilevel disc osteophytic disease with subluxations which are felt to be degenerative. Findings conveyed toSAM Sharicka Pogorzelski on 03/05/2013  at09:50.   Electronically Signed   By: Genevive Bi  M.D.   On: 03/05/2013 09:51   Ct Maxillofacial Wo Cm  03/05/2013   CLINICAL DATA:  Fall, head injury  EXAM: CT HEAD WITHOUT CONTRAST  CT MAXILLOFACIAL WITHOUT CONTRAST  CT CERVICAL SPINE WITHOUT CONTRAST  TECHNIQUE: Multidetector CT imaging of the head, cervical spine, and maxillofacial structures were performed using the standard protocol without intravenous contrast. Multiplanar CT image reconstructions of the cervical spine and maxillofacial structures were also generated.  COMPARISON:  CT HEAD W/O CM dated 08/28/2007; CT HEAD W/O CM dated 04/21/2007; CT HEAD W/O CM dated 06/15/2006; CT HEAD W/O CM dated 10/23/2003  FINDINGS: CT HEAD FINDINGS  There is a large superficial hematoma involving the preseptal space on the right and extending superficial to the right zygomatic arch. No intracranial hemorrhage. No parenchymal contusion. No midline shift or mass effect. Basilar cisterns are patent. No skull base fracture. No fluid in the paranasal sinuses or mastoid air cells.  There is extensive cortical atrophy and periventricular and subcortical white matter hypodensities.  CT MAXILLOFACIAL FINDINGS  Soft tissue swelling over the right preseptal space and zygomatic arch with subcutaneous hematoma. The hematoma measures 15 mm in depth. No associated fracture of the right orbit. The zygomatic arch on the right is intact. The intraconal contents on the right are normal. The globe is normal. The left orbit is normal. The left zygomatic arch is normal.  There is significant motion degradation of the inferior aspect of the images with phase. This results in the [place] artifacts through the right mandible body. Cannot exclude a right mandibular fracture. The mandibular condyles are intact the located. The exam is motion degraded. No evidence of maxillary bone fracture. Pterygoid plates are normal. No nasal bone fracture.  CT CERVICAL SPINE FINDINGS  There is scoliosis of this cervical spine. There is mild retrolisthesis of C3 on  C4 which is likely degenerative. There is mild anterolisthesis of C6 on C7 which likely degenerative. No acute loss of vertebral body height. Normal facet articulation. Normal craniocervical junction. No evidence of epidural paraspinal hematoma.  There is a fracture through the transverse process of the C3 vertebral body on the left. This fracture is through the vertebral artery canal. The fracture appears fairly well corticated and is less indeterminate  in age.  IMPRESSION: 1. Scalp hematoma involving the right preseptal space and extending along the right zygomatic arch. No associated fracture of the orbit or injury to the globe or intraconal contents. 2. No intracranial trauma. 3. Extensive microvascular disease and atrophy. 4. Severe motion degradation through the inferior face. Cannot exclude a fracture of the right mandible with significant step-off artifact. Favor motion artifact. Recommend clinical correlation. 5. There is a nondisplaced fracture of through the left transverse process at C3 which traverses the vertebral artery canal. This fracture is age indeterminate. 6. No additional evidence of cervical spine fracture. There is multilevel disc osteophytic disease with subluxations which are felt to be degenerative. Findings conveyed toSAM Tylia Ewell on 03/05/2013  at09:50.   Electronically Signed   By: Suzy Bouchard M.D.   On: 03/05/2013 09:51      ED Course  Procedures (including critical care time) Labs Review Labs Reviewed - No data to display Imaging Review No results found.  EKG Interpretation   None      CT scans discussed with Dr.Edmunds MDM  No diagnosis found. Clinically patient does not have any evidence of a mandible fracture. She has no swelling or tenderness around.no deformity. I discussed the cervical spine CT scan with Dr.Elsner who reviewed the films and feels that fracture is old requiring no intervention Foreead laceration was repaired by Ms.Palmer I spoke at  length with patient's son who states that her mental status is at baseline Plan return to skilled nursing facility. Sutures out 5 days. Diagnosis #1 fall #2 minor closed head trauma #3 fore head laceration (3cm) #4 contusions multiple sites     Orlie Dakin, MD 03/05/13 1157

## 2013-03-05 NOTE — ED Notes (Signed)
Bed: WA05 Expected date:  Expected time:  Means of arrival:  Comments: 

## 2013-03-05 NOTE — ED Notes (Signed)
Per EMS, Pt, from [facility], presents after a witnessed fall.  Facility sts Pt fell out of wheelchair.  Hematoma noted to R face.  Hx of dementia and A fib.  No blood thinners.  Facility sts Pt is at neuro baseline.

## 2013-03-05 NOTE — ED Notes (Signed)
Initial Contact - pt sleeping soundly on stretcher, family at bs, arousable to verbal stim.  Pt denies pain or complaints at this time.  Wound repair complete.  Pt awaiting d/c back to facility.  Skin otherwise PWD.  RR even/un-lab.  MAEI.  Neuros grossly intact.  Afib on monitor.  NAD.

## 2013-03-05 NOTE — ED Provider Notes (Signed)
Medical screening examination/treatment/procedure(s) were conducted as a shared visit with non-physician practitioner(s) and myself.  I personally evaluated the patient during the encounter.  EKG Interpretation   None        Orlie Dakin, MD 03/05/13 1159

## 2013-05-30 ENCOUNTER — Other Ambulatory Visit: Payer: Self-pay | Admitting: *Deleted

## 2013-05-30 MED ORDER — TEMAZEPAM 7.5 MG PO CAPS: 7.5000 mg | ORAL_CAPSULE | Freq: Every evening | ORAL | Status: AC | PRN

## 2013-05-30 NOTE — Telephone Encounter (Signed)
[facility] 

## 2013-05-31 ENCOUNTER — Non-Acute Institutional Stay (SKILLED_NURSING_FACILITY): Payer: PRIVATE HEALTH INSURANCE | Admitting: Internal Medicine

## 2013-05-31 DIAGNOSIS — F0280 Dementia in other diseases classified elsewhere without behavioral disturbance: Secondary | ICD-10-CM

## 2013-05-31 DIAGNOSIS — N183 Chronic kidney disease, stage 3 unspecified: Secondary | ICD-10-CM

## 2013-05-31 DIAGNOSIS — I4891 Unspecified atrial fibrillation: Secondary | ICD-10-CM

## 2013-05-31 DIAGNOSIS — E039 Hypothyroidism, unspecified: Secondary | ICD-10-CM

## 2013-05-31 DIAGNOSIS — G309 Alzheimer's disease, unspecified: Principal | ICD-10-CM

## 2013-05-31 NOTE — Progress Notes (Signed)
          PROGRESS NOTE  DATE: 05-31-13  FACILITY: Nursing Home Location: [facility]  LEVEL OF CARE: SNF (31)  Routine Visit  CHIEF COMPLAINT:  Manage Alzheimer's dementia, atrial fibrillation and hypothyroidism  HISTORY OF PRESENT ILLNESS:  REASSESSMENT OF ONGOING PROBLEM(S):  ATRIAL FIBRILLATION: the patients atrial fibrillation remains stable.  The staff deny DOE, tachycardia, orthopnea, transient neurological sx, pedal edema, palpitations, & PNDs.  No complications noted from the medications currently being used.  DEMENTIA: The dementia remaines stable and continues to function adequately in the current living environment with supervision.  The patient has had little changes in behavior. No complications noted from the medications presently being used. Advanced. Patient does not follow commands.  HYPOTHYROIDISM: The hypothyroidism remains stable. No complications noted from the medications presently being used.  The patient denies fatigue or constipation.  Last TSH 4.64 in 6/14, in 7-14 TSH 1.32, in 10/14 TSH 5.289, in 11- 14 TSH 1.14, in 12-14 TSH 3.098, 3-15 TSH 3.034.  PAST MEDICAL HISTORY : Reviewed.  No changes.  CURRENT MEDICATIONS: Reviewed per [facility]  REVIEW OF SYSTEMS: Unobtainable due to dementia.  PHYSICAL EXAMINATION  VS:  See vital signs section  GENERAL: no acute distress, normal body habitus EYES: conjunctivae normal, sclerae normal, normal eye lids NECK: supple, trachea midline, no neck masses, no thyroid tenderness, no thyromegaly LYMPHATICS: no LAN in the neck, no supraclavicular LAN RESPIRATORY: breathing is even & unlabored, BS CTAB CARDIAC: RRR, no murmur,no extra heart sounds, no edema GI: abdomen soft, normal BS, no masses, no tenderness, no hepatomegaly, no splenomegaly PSYCHIATRIC: the patient is alert & disoriented, affect & behavior appropriate  LABS/RADIOLOGY: 4-15 Depakote  Level 27.8 3-15 MCV 112 otherwise CBC  normal, hemoglobin A1c 6, digoxin level 0.4, BUN 33 otherwise BMP normal 2-15 vitamin B12 level 562, RBC folate 726 12-14 liver profile normal, BUN 41 otherwise BMP normal, MCV 100. 7 otherwise CBC normal, Depakote level 16.4 10-14 CO2 13, glucose 106, BUN 24, creatinine 1.37 otherwise BMP normal, digoxin 0.4, CBC normal 9-14 hemoglobin A1c 6  6/14 digoxin 0.7, CBC normal, glucose 118, BUN 27, creatinine 1.18 otherwise BMP normal 5/14 glucose 118, BUN 29, creatinine 1.7 otherwise CMP normal 3/14 hemoglobin A1c 6.2  ASSESSMENT/PLAN:  Alzheimer's dementia-advanced.  Atrial fibrillation-rate controlled. Hypothyroidism-well controlled Chronic kidney disease stage III- stable. Diabetes mellitus-diet controlled.  Depression-stable. Insomnia-on restoril Macrocytosis- RBC folate and vitamin B12 level normal  CPT CODE: 70263  Edgar Frisk. Durwin Reges, [place] [phone]

## 2013-07-09 NOTE — H&P (Signed)
  Subjective:    Patient ID: [name] is a 81 y.o. female.  HPI  Referred by Dr. Elvera Lennox for evaluation of lesion left neck. Patient has dementia and accompanied by son, no prior biopsy. Has been present for appr month, apparent bleeding at this time. Has had previous SCC excised from arm and remote history melanoma back.   Pt is DNR.   Review of Systems  Constitutional: Positive for activity change and appetite change.  HENT: Positive for hearing loss.  Musculoskeletal: Positive for back pain, joint swelling and arthralgias.  Hematological: Bruises/bleeds easily.  Psychiatric/Behavioral: Positive for dysphoric mood.       Objective:    Physical Exam  Neck:  Left neck with nodular ulcerated 1cm mass  Cardiovascular: Normal heart sounds.  irregular  Pulmonary/Chest: Effort normal and breath sounds normal.  Neurological:  Cooperative, not oriented to place or time       Assessment:      Suspect SCC, ulcerated     Plan:      Given dementia, will plan excision with sedation. Ok to continue aspirin. Son to be present to aid with questions. Reviewed risks wound healing problems, recurrence, need for additional procedure pending pathology.

## 2013-07-12 ENCOUNTER — Encounter (HOSPITAL_BASED_OUTPATIENT_CLINIC_OR_DEPARTMENT_OTHER): Payer: Self-pay | Admitting: *Deleted

## 2013-07-12 NOTE — Progress Notes (Signed)
Called [facility] for records-will get them to do bmet-ekg-talked with som Marty-poa-he will bring papers and be here 6am dos-address given

## 2013-07-13 ENCOUNTER — Encounter (HOSPITAL_BASED_OUTPATIENT_CLINIC_OR_DEPARTMENT_OTHER): Payer: Self-pay | Admitting: *Deleted

## 2013-07-13 NOTE — Progress Notes (Signed)
Talked with nurse [facility] and med list faxed-faxed instructions and to get ekg and bmet-fax here 6/8 Son Jerrye Beavers coming-poa=to bring papers

## 2013-07-17 ENCOUNTER — Ambulatory Visit (HOSPITAL_BASED_OUTPATIENT_CLINIC_OR_DEPARTMENT_OTHER)
Admission: RE | Admit: 2013-07-17 | Discharge: 2013-07-17 | Disposition: A | Discharge status: Skilled Nursing Facility | Payer: PRIVATE HEALTH INSURANCE | Source: Ambulatory Visit | Attending: Plastic Surgery | Admitting: Plastic Surgery

## 2013-07-17 ENCOUNTER — Ambulatory Visit (HOSPITAL_BASED_OUTPATIENT_CLINIC_OR_DEPARTMENT_OTHER): Payer: PRIVATE HEALTH INSURANCE | Admitting: Anesthesiology

## 2013-07-17 ENCOUNTER — Encounter (HOSPITAL_BASED_OUTPATIENT_CLINIC_OR_DEPARTMENT_OTHER): Payer: PRIVATE HEALTH INSURANCE | Admitting: Anesthesiology

## 2013-07-17 ENCOUNTER — Encounter (HOSPITAL_BASED_OUTPATIENT_CLINIC_OR_DEPARTMENT_OTHER)
Admission: RE | Disposition: A | Discharge status: Skilled Nursing Facility | Payer: Self-pay | Source: Ambulatory Visit | Attending: Plastic Surgery

## 2013-07-17 ENCOUNTER — Encounter (HOSPITAL_BASED_OUTPATIENT_CLINIC_OR_DEPARTMENT_OTHER): Payer: Self-pay | Admitting: Anesthesiology

## 2013-07-17 DIAGNOSIS — Z85820 Personal history of malignant melanoma of skin: Secondary | ICD-10-CM | POA: Diagnosis not present

## 2013-07-17 DIAGNOSIS — H9190 Unspecified hearing loss, unspecified ear: Secondary | ICD-10-CM | POA: Insufficient documentation

## 2013-07-17 DIAGNOSIS — F0390 Unspecified dementia without behavioral disturbance: Secondary | ICD-10-CM | POA: Insufficient documentation

## 2013-07-17 DIAGNOSIS — C4442 Squamous cell carcinoma of skin of scalp and neck: Secondary | ICD-10-CM | POA: Insufficient documentation

## 2013-07-17 DIAGNOSIS — C490 Malignant neoplasm of connective and soft tissue of head, face and neck: Secondary | ICD-10-CM | POA: Insufficient documentation

## 2013-07-17 DIAGNOSIS — F329 Major depressive disorder, single episode, unspecified: Secondary | ICD-10-CM | POA: Diagnosis not present

## 2013-07-17 DIAGNOSIS — I10 Essential (primary) hypertension: Secondary | ICD-10-CM | POA: Insufficient documentation

## 2013-07-17 DIAGNOSIS — F411 Generalized anxiety disorder: Secondary | ICD-10-CM | POA: Diagnosis not present

## 2013-07-17 DIAGNOSIS — L989 Disorder of the skin and subcutaneous tissue, unspecified: Secondary | ICD-10-CM | POA: Diagnosis present

## 2013-07-17 DIAGNOSIS — K219 Gastro-esophageal reflux disease without esophagitis: Secondary | ICD-10-CM | POA: Insufficient documentation

## 2013-07-17 DIAGNOSIS — F3289 Other specified depressive episodes: Secondary | ICD-10-CM | POA: Insufficient documentation

## 2013-07-17 DIAGNOSIS — D492 Neoplasm of unspecified behavior of bone, soft tissue, and skin: Secondary | ICD-10-CM

## 2013-07-17 HISTORY — DX: Presence of dental prosthetic device (complete) (partial): Z97.2

## 2013-07-17 HISTORY — DX: Presence of dental prosthetic device (complete) (partial): K08.109

## 2013-07-17 HISTORY — PX: MASS EXCISION: SHX2000

## 2013-07-17 HISTORY — DX: Unspecified hearing loss, unspecified ear: H91.90

## 2013-07-17 HISTORY — DX: Gastro-esophageal reflux disease without esophagitis: K21.9

## 2013-07-17 HISTORY — DX: Personal history of pulmonary embolism: Z86.711

## 2013-07-17 HISTORY — DX: Unspecified osteoarthritis, unspecified site: M19.90

## 2013-07-17 HISTORY — DX: Presence of spectacles and contact lenses: Z97.3

## 2013-07-17 HISTORY — DX: Unspecified urinary incontinence: R32

## 2013-07-17 HISTORY — DX: Essential (primary) hypertension: I10

## 2013-07-17 SURGERY — EXCISION MASS
Anesthesia: Monitor Anesthesia Care | Site: Neck | Laterality: Left

## 2013-07-17 MED ORDER — MIDAZOLAM HCL 2 MG/2ML IJ SOLN
1.0000 mg | INTRAMUSCULAR | Status: DC | PRN
Start: 2013-07-17 — End: 2013-07-17

## 2013-07-17 MED ORDER — FENTANYL CITRATE 0.05 MG/ML IJ SOLN: 50.0000 ug | INTRAMUSCULAR | Status: DC | PRN

## 2013-07-17 MED ORDER — FENTANYL CITRATE 0.05 MG/ML IJ SOLN
25.0000 ug | INTRAMUSCULAR | Status: DC | PRN
Start: 2013-07-17 — End: 2013-07-17

## 2013-07-17 MED ORDER — LACTATED RINGERS IV SOLN
INTRAVENOUS | Status: DC | PRN
  Administered 2013-07-17: 07:00:00 via INTRAVENOUS

## 2013-07-17 MED ORDER — LIDOCAINE HCL (CARDIAC) 20 MG/ML IV SOLN
INTRAVENOUS | Status: DC | PRN
  Administered 2013-07-17: 20 mg via INTRAVENOUS

## 2013-07-17 MED ORDER — LIDOCAINE-EPINEPHRINE 1 %-1:100000 IJ SOLN
INTRAMUSCULAR | Status: AC
  Filled 2013-07-17: qty 1

## 2013-07-17 MED ORDER — PROPOFOL 10 MG/ML IV BOLUS
INTRAVENOUS | Status: DC | PRN
  Administered 2013-07-17: 20 mg via INTRAVENOUS

## 2013-07-17 MED ORDER — LACTATED RINGERS IV SOLN: INTRAVENOUS | Status: DC

## 2013-07-17 MED ORDER — CEFAZOLIN SODIUM-DEXTROSE 2-3 GM-% IV SOLR
INTRAVENOUS | Status: DC | PRN
  Administered 2013-07-17: 2 g via INTRAVENOUS

## 2013-07-17 MED ORDER — LIDOCAINE-EPINEPHRINE 1 %-1:100000 IJ SOLN
INTRAMUSCULAR | Status: DC | PRN
  Administered 2013-07-17: 9 mL

## 2013-07-17 MED ORDER — FENTANYL CITRATE 0.05 MG/ML IJ SOLN
INTRAMUSCULAR | Status: AC
  Filled 2013-07-17: qty 2

## 2013-07-17 SURGICAL SUPPLY — 55 items
ADH SKN CLS APL DERMABOND .7 (GAUZE/BANDAGES/DRESSINGS)
APL SKNCLS STERI-STRIP NONHPOA (GAUZE/BANDAGES/DRESSINGS)
BENZOIN TINCTURE PRP APPL 2/3 (GAUZE/BANDAGES/DRESSINGS) IMPLANT
BLADE 15 SAFETY STRL DISP (BLADE) ×3 IMPLANT
BLADE SURG ROTATE 9660 (MISCELLANEOUS) IMPLANT
CANISTER SUCT 1200ML W/VALVE (MISCELLANEOUS) IMPLANT
CHLORAPREP W/TINT 26ML (MISCELLANEOUS) ×3 IMPLANT
CLOSURE WOUND 1/2 X4 (GAUZE/BANDAGES/DRESSINGS)
COVER MAYO STAND STRL (DRAPES) ×1 IMPLANT
COVER TABLE BACK 60X90 (DRAPES) ×1 IMPLANT
DERMABOND ADVANCED (GAUZE/BANDAGES/DRESSINGS)
DERMABOND ADVANCED .7 DNX12 (GAUZE/BANDAGES/DRESSINGS) IMPLANT
DRAIN JP 10F RND SILICONE (MISCELLANEOUS) IMPLANT
DRAPE PED LAPAROTOMY (DRAPES) IMPLANT
DRAPE U-SHAPE 76X120 STRL (DRAPES) ×3 IMPLANT
ELECT COATED BLADE 2.86 ST (ELECTRODE) IMPLANT
ELECT NDL BLADE 2-5/6 (NEEDLE) ×1 IMPLANT
ELECT NEEDLE BLADE 2-5/6 (NEEDLE) ×3 IMPLANT
ELECT REM PT RETURN 9FT ADLT (ELECTROSURGICAL)
ELECT REM PT RETURN 9FT PED (ELECTROSURGICAL)
ELECTRODE REM PT RETRN 9FT PED (ELECTROSURGICAL) IMPLANT
ELECTRODE REM PT RTRN 9FT ADLT (ELECTROSURGICAL) IMPLANT
EVACUATOR SILICONE 100CC (DRAIN) IMPLANT
GAUZE XEROFORM 1X8 LF (GAUZE/BANDAGES/DRESSINGS) IMPLANT
GLOVE BIO SURGEON STRL SZ 6 (GLOVE) ×3 IMPLANT
GLOVE ECLIPSE 6.5 STRL STRAW (GLOVE) ×2 IMPLANT
GOWN STRL REUS W/ TWL LRG LVL3 (GOWN DISPOSABLE) ×2 IMPLANT
GOWN STRL REUS W/TWL LRG LVL3 (GOWN DISPOSABLE) ×6
NDL HYPO 30GX1 BEV (NEEDLE) IMPLANT
NEEDLE 27GAX1X1/2 (NEEDLE) ×3 IMPLANT
NEEDLE HYPO 30GX1 BEV (NEEDLE) IMPLANT
NS IRRIG 1000ML POUR BTL (IV SOLUTION) IMPLANT
PACK BASIN DAY SURGERY FS (CUSTOM PROCEDURE TRAY) ×3 IMPLANT
PENCIL BUTTON HOLSTER BLD 10FT (ELECTRODE) IMPLANT
RUBBERBAND STERILE (MISCELLANEOUS) IMPLANT
SHEET MEDIUM DRAPE 40X70 STRL (DRAPES) IMPLANT
SPONGE GAUZE 2X2 8PLY STER LF (GAUZE/BANDAGES/DRESSINGS)
SPONGE GAUZE 2X2 8PLY STRL LF (GAUZE/BANDAGES/DRESSINGS) IMPLANT
SPONGE GAUZE 4X4 12PLY STER LF (GAUZE/BANDAGES/DRESSINGS) IMPLANT
SPONGE LAP 18X18 X RAY DECT (DISPOSABLE) IMPLANT
STRIP CLOSURE SKIN 1/2X4 (GAUZE/BANDAGES/DRESSINGS) IMPLANT
SUCTION FRAZIER TIP 10 FR DISP (SUCTIONS) IMPLANT
SUT ETHILON 4 0 PS 2 18 (SUTURE) IMPLANT
SUT ETHILON 5 0 P 3 18 (SUTURE)
SUT MNCRL AB 4-0 PS2 18 (SUTURE) IMPLANT
SUT NYLON ETHILON 5-0 P-3 1X18 (SUTURE) IMPLANT
SUT PLAIN 5 0 P 3 18 (SUTURE) IMPLANT
SUT VICRYL 4-0 PS2 18IN ABS (SUTURE) ×3 IMPLANT
SYR BULB 3OZ (MISCELLANEOUS) IMPLANT
SYR CONTROL 10ML LL (SYRINGE) ×3 IMPLANT
TOWEL OR 17X24 6PK STRL BLUE (TOWEL DISPOSABLE) ×3 IMPLANT
TRAY DSU PREP LF (CUSTOM PROCEDURE TRAY) IMPLANT
TUBE CONNECTING 20'X1/4 (TUBING)
TUBE CONNECTING 20X1/4 (TUBING) IMPLANT
UNDERPAD 30X30 INCONTINENT (UNDERPADS AND DIAPERS) ×2 IMPLANT

## 2013-07-17 NOTE — Transfer of Care (Signed)
Immediate Anesthesia Transfer of Care Note  Patient: [name]  Procedure(s) Performed: Procedure(s): EXCISION OF LESION ON LEFT ANTERIOR NECK (Left)  Patient Location: PACU  Anesthesia Type:MAC  Level of Consciousness: sedated  Airway & Oxygen Therapy: Patient Spontanous Breathing and Patient connected to face mask oxygen  Post-op Assessment: Report given to PACU RN and Post -op Vital signs reviewed and stable  Post vital signs: Reviewed and stable  Complications: No apparent anesthesia complications

## 2013-07-17 NOTE — Interval H&P Note (Signed)
History and Physical Interval Note:  07/17/2013 6:47 AM  [name]  has presented today for surgery, with the diagnosis of Neoplasm of uncertain behavior of skin on the Neck  The various methods of treatment have been discussed with the patient and family. After consideration of risks, benefits and other options for treatment, the patient has consented to  Procedure(s): EXCISION OF LESION ON LEFT ANTERIOR NECK (Left) as a surgical intervention .  The patient's history has been reviewed, patient examined, no change in status, stable for surgery.  I have reviewed the patient's chart and labs.  Questions were answered to the patient's satisfaction.     Arnoldo Hooker Annett Boxwell

## 2013-07-17 NOTE — Discharge Instructions (Signed)

## 2013-07-17 NOTE — Anesthesia Postprocedure Evaluation (Signed)
  Anesthesia Post-op Note  Patient: [name]  Procedure(s) Performed: Procedure(s): EXCISION OF LESION ON LEFT ANTERIOR NECK (Left)  Patient Location: PACU  Anesthesia Type:General  Level of Consciousness: awake and alert   Airway and Oxygen Therapy: Patient Spontanous Breathing  Post-op Pain: none  Post-op Assessment: Post-op Vital signs reviewed  Post-op Vital Signs: Reviewed  Last Vitals:  Filed Vitals:   07/17/13 0815  BP: 140/58  Pulse: 60  Temp:   Resp: 25    Complications: No apparent anesthesia complications

## 2013-07-17 NOTE — Op Note (Signed)
Operative Note   DATE OF OPERATION: 6.9.2015  LOCATION: [facility]- outpatient  SURGICAL DIVISION: Plastic Surgery  PREOPERATIVE DIAGNOSES:  Neoplasm neck  POSTOPERATIVE DIAGNOSES:  same  PROCEDURE: 1. Excision malignant lesion neck 2 cm 2. Layered closure neck 5 cm  SURGEON: Irene Limbo MD MBA  ASSISTANT: none  ANESTHESIA:  MAC.   EBL: minimal  COMPLICATIONS: None.   INDICATIONS FOR PROCEDURE:  The patient, [name], is a 80 y.o. female born on 16-Dec-1920, is here for excision of suspected skin cancer of neck. Given her underlying dementia, plan excision with sedation.   FINDINGS: Hypertrophic lesion left neck  DESCRIPTION OF PROCEDURE:  The patient's operative site was marked with the patient and son in the preoperative area. The patient was taken to the operating room. SCDs were placed and IV antibiotics were given. The patient's operative site was prepped and draped in a sterile fashion. A time out was performed and all information was confirmed to be correct. Local anesthetic infiltrated surrounding lesion. Currette used to debulk tumor and aid with identification margins. A portion of this debulked tumor was sent as separate biopsy specimen. Excision of lesion with margins completed sharply. Layered closure completed with 4-0 vicryl in dermis followed by running 5-0 plain gut skin closure. Dermabond applied.   The patient was taken to the recovery room in satisfactory condition.   SPECIMENS: 1. Left neck mass biopsy 2. Left neck mass excision  DRAINS: none  Irene Limbo, MD [facility] [phone]

## 2013-07-17 NOTE — Anesthesia Preprocedure Evaluation (Signed)
Anesthesia Evaluation  Patient identified by MRN, date of birth, ID band Patient confused    Reviewed: Allergy & Precautions, H&P , NPO status , Patient's Chart, lab work & pertinent test results, reviewed documented beta blocker date and time   Airway       Dental no notable dental hx.    Pulmonary neg pulmonary ROS,    Pulmonary exam normal       Cardiovascular hypertension, On Medications and On Home Beta Blockers     Neuro/Psych Anxiety Depression negative neurological ROS     GI/Hepatic Neg liver ROS, GERD-  Medicated and Controlled,  Endo/Other  diabetesHypothyroidism   Renal/GU Renal disease  negative genitourinary   Musculoskeletal   Abdominal   Peds  Hematology negative hematology ROS (+)   Anesthesia Other Findings   Reproductive/Obstetrics negative OB ROS                           Anesthesia Physical Anesthesia Plan  ASA: III  Anesthesia Plan: MAC   Post-op Pain Management:    Induction: Intravenous  Airway Management Planned: Simple Face Mask  Additional Equipment:   Intra-op Plan:   Post-operative Plan:   Informed Consent: I have reviewed the patients History and Physical, chart, labs and discussed the procedure including the risks, benefits and alternatives for the proposed anesthesia with the patient or authorized representative who has indicated his/her understanding and acceptance.   Dental advisory given  Plan Discussed with: CRNA  Anesthesia Plan Comments:         Anesthesia Quick Evaluation

## 2013-07-18 ENCOUNTER — Encounter (HOSPITAL_BASED_OUTPATIENT_CLINIC_OR_DEPARTMENT_OTHER): Payer: Self-pay | Admitting: Plastic Surgery

## 2013-10-26 ENCOUNTER — Encounter: Payer: Self-pay | Admitting: Internal Medicine

## 2013-10-26 ENCOUNTER — Non-Acute Institutional Stay (SKILLED_NURSING_FACILITY): Payer: PRIVATE HEALTH INSURANCE | Admitting: Internal Medicine

## 2013-10-26 DIAGNOSIS — N183 Chronic kidney disease, stage 3 unspecified: Secondary | ICD-10-CM

## 2013-10-26 DIAGNOSIS — K219 Gastro-esophageal reflux disease without esophagitis: Secondary | ICD-10-CM

## 2013-10-26 DIAGNOSIS — I739 Peripheral vascular disease, unspecified: Secondary | ICD-10-CM

## 2013-10-26 DIAGNOSIS — F03918 Unspecified dementia, unspecified severity, with other behavioral disturbance: Secondary | ICD-10-CM

## 2013-10-26 DIAGNOSIS — E039 Hypothyroidism, unspecified: Secondary | ICD-10-CM

## 2013-10-26 DIAGNOSIS — F0391 Unspecified dementia with behavioral disturbance: Secondary | ICD-10-CM

## 2013-10-26 DIAGNOSIS — E119 Type 2 diabetes mellitus without complications: Secondary | ICD-10-CM

## 2013-10-26 DIAGNOSIS — I10 Essential (primary) hypertension: Secondary | ICD-10-CM

## 2013-10-26 NOTE — Progress Notes (Signed)
MRN: [id number] Name: [name]  Sex: female Age: 80 y.o. DOB: Dec 12, 1920  [facility] Facility/Room: 206D Level Of Care: SNF Provider: Inocencio Homes D Emergency Contacts: Extended Emergency Contact Information Primary Emergency Contact: [facility] Address: [address]          [place] [address] Johnnette Litter of [place] Phone: [phone] Mobile Phone: [phone] Relation: Son Secondary Emergency Contact: Shannon,Marty Address: [address]          [place], [place] [address] Johnnette Litter of [place] Phone: [phone] Mobile Phone: [phone] Relation: Son    Allergies: Review of patient's allergies indicates no known allergies.  Chief Complaint  Patient presents with  . Medical Management of Chronic Issues    HPI: Patient is 80 y.o. female who is being seen for routine issues.  Past Medical History  Diagnosis Date  . Alzheimer disease   . Hypothyroidism   . A-fib   . Depression   . Anxiety   . Insomnia   . Renal disorder   . Chronic kidney disease, stage 3   . Wears glasses   . Full dentures   . Arthritis   . History of pulmonary embolism 2006  . GERD (gastroesophageal reflux disease)   . Hypertension   . HOH (hard of hearing)   . Urinary incontinence   . Diabetes mellitus without complication     Past Surgical History  Procedure Laterality Date  . Mass excision Left 07/17/2013    Procedure: EXCISION OF LESION ON LEFT ANTERIOR NECK;  Surgeon: Irene Limbo, MD;  Location: [facility];  Service: Plastics;  Laterality: Left;      Medication List       This list is accurate as of: 10/26/13 11:59 PM.  Always use your most recent med list.               aluminum & magnesium hydroxide-simethicone 500-450-40 MG/5ML suspension  Commonly known as:  MYLANTA  Take 30 mLs by mouth every 6 (six) hours as needed for indigestion.     aspirin 325 MG EC tablet  Take 325 mg by mouth at bedtime. Give 30  minutes before Niacin.     bisacodyl 10 MG suppository  Commonly known as:  DULCOLAX  Place 10 mg rectally as needed for moderate constipation (If no BM with Milk of Magnesium.).     calcium carbonate 600 MG Tabs tablet  Commonly known as:  OS-CAL  Take 600 mg by mouth 2 (two) times daily with a meal.     cloNIDine 0.2 mg/24hr patch  Commonly known as:  CATAPRES - Dosed in mg/24 hr  Place 0.2 mg onto the skin once a week.     digoxin 0.125 MG tablet  Commonly known as:  LANOXIN  Take 0.125 mg by mouth every other day. Hold if apical HR <60     divalproex 125 MG capsule  Commonly known as:  DEPAKOTE SPRINKLE  Take 500 mg by mouth 2 (two) times daily. For mood disorder.     donepezil 5 MG tablet  Commonly known as:  ARICEPT  Take 5 mg by mouth at bedtime.     escitalopram 20 MG tablet  Commonly known as:  LEXAPRO  Take 20 mg by mouth daily.     HYDROcodone-acetaminophen 5-500 MG per tablet  Commonly known as:  VICODIN  Take 0.5 tablets by mouth 2 (two) times daily as needed for pain.     levothyroxine 75  MCG tablet  Commonly known as:  SYNTHROID, LEVOTHROID  Take 75 mcg by mouth daily before breakfast.     magnesium hydroxide 400 MG/5ML suspension  Commonly known as:  MILK OF MAGNESIA  Take 30 mLs by mouth daily as needed (If no BM for 3 days.).     memantine 10 MG tablet  Commonly known as:  NAMENDA  Take 10 mg by mouth 2 (two) times daily.     metoprolol tartrate 25 MG tablet  Commonly known as:  LOPRESSOR  Take 25 mg by mouth 2 (two) times daily. Hold HR less than 60     multivitamin with minerals tablet  Take 1 tablet by mouth daily.     PRESCRIPTION MEDICATION  Take 120 mLs by mouth 4 (four) times daily. Med Pass for weight maintenance.     ranitidine 150 MG capsule  Commonly known as:  ZANTAC  Take 150 mg by mouth 2 (two) times daily.     temazepam 7.5 MG capsule  Commonly known as:  RESTORIL  Take 1 capsule (7.5 mg total) by mouth at bedtime as  needed for sleep.     vitamin A & D ointment  Apply 1 application topically at bedtime. Apply to lower extremities every night for dry skin.     Vitamin D (Ergocalciferol) 50000 UNITS Caps capsule  Commonly known as:  DRISDOL  Take 50,000 Units by mouth every 30 (thirty) days.        No orders of the defined types were placed in this encounter.    Immunization History  Administered Date(s) Administered  . Influenza Whole 11/21/2006, 11/09/2012  . Tdap 03/05/2013    History  Substance Use Topics  . Smoking status: Never Smoker   . Smokeless tobacco: Never Used  . Alcohol Use: No    Review of Systems  DATA OBTAINED: from patient;no c/o GENERAL: Feels well no fevers, fatigue, appetite changes SKIN: No itching, rash HEENT: No complaint RESPIRATORY: No cough, wheezing, SOB CARDIAC: No chest pain, palpitations, lower extremity edema  GI: No abdominal pain, No N/V/D or constipation, No heartburn or reflux  GU: No dysuria, frequency or urgency, or incontinence  MUSCULOSKELETAL: No unrelieved bone/joint pain NEUROLOGIC: No headache, dizziness or focal weakness PSYCHIATRIC: No overt anxiety or sadness. Sleeps well.   Filed Vitals:   10/27/13 1227  BP: 137/76  Pulse: 78  Temp: 97.1 F (36.2 C)  Resp: 18    Physical Exam  GENERAL APPEARANCE: Alert, conversant. Appropriately groomed. No acute distress  SKIN: No diaphoresis rash HEENT:HOH RESPIRATORY: Breathing is even, unlabored. Lung sounds are clear   CARDIOVASCULAR: Heart irreg no murmurs, rubs or gallops. No peripheral edema  GASTROINTESTINAL: Abdomen is soft, non-tender, not distended w/ normal bowel sounds.  GENITOURINARY: Bladder non tender, not distended  MUSCULOSKELETAL: some contracture of BLE NEUROLOGIC: Cranial nerves 2-12 grossly intact. Moves all extremities . PSYCHIATRIC: dementia, no behavioral issues  Patient Active Problem List   Diagnosis Date Noted  . PVD (peripheral vascular disease)  10/27/2013  . Diabetes mellitus without complication   . Alzheimer's disease 08/22/2012  . Unspecified hypothyroidism 08/22/2012  . Chronic kidney disease, stage III (moderate) 08/22/2012  . PRESSURE ULCER STAGE I 07/14/2007  . EDEMA 07/14/2007  . UTI 06/09/2007  . Dementia with behavioral disturbance 03/14/2007  . WEIGHT LOSS, ABNORMAL 03/14/2007  . ATRIAL FIBRILLATION, PAROXYSMAL 01/03/2007  . BACK PAIN 09/30/2006  . HYPERLIPIDEMIA 06/01/2006  . HYPERTENSION 06/01/2006  . GERD 06/01/2006  . OSTEOARTHRITIS 06/01/2006  . OSTEOPOROSIS  06/01/2006  . HIP FRACTURE, LEFT 06/01/2006  . PULMONARY EMBOLISM, HX OF 06/01/2006    CBC    Component Value Date/Time   WBC 7.7 08/21/2007 0540   RBC 3.61* 08/21/2007 0540   HGB 11.6* 08/21/2007 0540   HCT 34.5* 08/21/2007 0540   PLT 299 08/21/2007 0540   MCV 95.4 08/21/2007 0540   LYMPHSABS 1.4 08/07/2007 0330   MONOABS 0.2 08/07/2007 0330   EOSABS 0.0 08/07/2007 0330   BASOSABS 0.0 08/07/2007 0330    CMP     Component Value Date/Time   NA 139 08/19/2007 1110   K 4.2 08/19/2007 1110   CL 108 08/19/2007 1110   CO2 23 08/19/2007 1110   GLUCOSE 85 08/19/2007 1110   BUN 26* 08/19/2007 1110   CREATININE 1.11 08/19/2007 1110   CALCIUM 8.6 08/19/2007 1110   PROT 6.5 08/06/2007 0525   ALBUMIN 2.5* 08/06/2007 0525   AST 40* 08/06/2007 0525   ALT 29 08/06/2007 0525   ALKPHOS 65 08/06/2007 0525   BILITOT 0.6 08/06/2007 0525   GFRNONAA 47* 08/19/2007 1110   GFRAA  Value: 57        The eGFR has been calculated using the MDRD equation. This calculation has not been validated in all clinical* 08/19/2007 1110    Assessment and Plan  HYPERTENSION Controlled on clonidine and lopressor  Unspecified hypothyroidism TSH was 1.821  In 08/2013 on 75 mcg;to continue  Dementia with behavioral disturbance Followed by psych; has occ outburst but beter control in past month;on aricept,namenda and depakote  PVD (peripheral vascular disease) Continue ASA;no recent  wounds  Diabetes mellitus without complication Hb 5.7 in 0/2637 on no meds  GERD Continue zantca 150 mg BID  Chronic kidney disease, stage III (moderate) GFR 39, CrCl -23 on 07/2013    Hennie Duos, MD

## 2013-10-27 ENCOUNTER — Encounter: Payer: Self-pay | Admitting: Internal Medicine

## 2013-10-27 DIAGNOSIS — I739 Peripheral vascular disease, unspecified: Secondary | ICD-10-CM | POA: Insufficient documentation

## 2013-10-27 DIAGNOSIS — E119 Type 2 diabetes mellitus without complications: Secondary | ICD-10-CM | POA: Insufficient documentation

## 2013-10-27 NOTE — Assessment & Plan Note (Signed)
TSH was 1.821  In 08/2013 on 75 mcg;to continue

## 2013-10-27 NOTE — Assessment & Plan Note (Signed)
Followed by psych; has occ outburst but beter control in past month;on aricept,namenda and depakote

## 2013-10-27 NOTE — Assessment & Plan Note (Signed)
Continue zantca 150 mg BID

## 2013-10-27 NOTE — Assessment & Plan Note (Signed)
GFR 39, CrCl -23 on 07/2013

## 2013-10-27 NOTE — Assessment & Plan Note (Signed)
Continue ASA;no recent wounds

## 2013-10-27 NOTE — Assessment & Plan Note (Signed)
Controlled on clonidine and lopressor

## 2013-10-27 NOTE — Assessment & Plan Note (Signed)
Hb 5.7 in 08/2013 on no meds

## 2013-11-23 ENCOUNTER — Encounter (HOSPITAL_BASED_OUTPATIENT_CLINIC_OR_DEPARTMENT_OTHER): Payer: Self-pay | Admitting: Emergency Medicine

## 2013-11-23 ENCOUNTER — Emergency Department (HOSPITAL_BASED_OUTPATIENT_CLINIC_OR_DEPARTMENT_OTHER)
Admission: EM | Admit: 2013-11-23 | Discharge: 2013-11-23 | Disposition: A | Discharge status: Home / Self Care | Payer: PRIVATE HEALTH INSURANCE | Attending: Emergency Medicine | Admitting: Emergency Medicine

## 2013-11-23 ENCOUNTER — Emergency Department (HOSPITAL_BASED_OUTPATIENT_CLINIC_OR_DEPARTMENT_OTHER): Payer: PRIVATE HEALTH INSURANCE

## 2013-11-23 DIAGNOSIS — F419 Anxiety disorder, unspecified: Secondary | ICD-10-CM | POA: Diagnosis not present

## 2013-11-23 DIAGNOSIS — E119 Type 2 diabetes mellitus without complications: Secondary | ICD-10-CM | POA: Insufficient documentation

## 2013-11-23 DIAGNOSIS — S0181XA Laceration without foreign body of other part of head, initial encounter: Secondary | ICD-10-CM

## 2013-11-23 DIAGNOSIS — N183 Chronic kidney disease, stage 3 (moderate): Secondary | ICD-10-CM | POA: Diagnosis not present

## 2013-11-23 DIAGNOSIS — E039 Hypothyroidism, unspecified: Secondary | ICD-10-CM | POA: Insufficient documentation

## 2013-11-23 DIAGNOSIS — G4700 Insomnia, unspecified: Secondary | ICD-10-CM | POA: Diagnosis not present

## 2013-11-23 DIAGNOSIS — Z98811 Dental restoration status: Secondary | ICD-10-CM | POA: Insufficient documentation

## 2013-11-23 DIAGNOSIS — Z79899 Other long term (current) drug therapy: Secondary | ICD-10-CM | POA: Diagnosis not present

## 2013-11-23 DIAGNOSIS — W050XXA Fall from non-moving wheelchair, initial encounter: Secondary | ICD-10-CM | POA: Insufficient documentation

## 2013-11-23 DIAGNOSIS — M1990 Unspecified osteoarthritis, unspecified site: Secondary | ICD-10-CM | POA: Insufficient documentation

## 2013-11-23 DIAGNOSIS — I129 Hypertensive chronic kidney disease with stage 1 through stage 4 chronic kidney disease, or unspecified chronic kidney disease: Secondary | ICD-10-CM | POA: Diagnosis not present

## 2013-11-23 DIAGNOSIS — Y9389 Activity, other specified: Secondary | ICD-10-CM | POA: Diagnosis not present

## 2013-11-23 DIAGNOSIS — K219 Gastro-esophageal reflux disease without esophagitis: Secondary | ICD-10-CM | POA: Diagnosis not present

## 2013-11-23 DIAGNOSIS — Z7982 Long term (current) use of aspirin: Secondary | ICD-10-CM | POA: Insufficient documentation

## 2013-11-23 DIAGNOSIS — I4891 Unspecified atrial fibrillation: Secondary | ICD-10-CM | POA: Diagnosis not present

## 2013-11-23 DIAGNOSIS — S0121XA Laceration without foreign body of nose, initial encounter: Secondary | ICD-10-CM | POA: Insufficient documentation

## 2013-11-23 DIAGNOSIS — F329 Major depressive disorder, single episode, unspecified: Secondary | ICD-10-CM | POA: Diagnosis not present

## 2013-11-23 DIAGNOSIS — Y92129 Unspecified place in nursing home as the place of occurrence of the external cause: Secondary | ICD-10-CM | POA: Diagnosis not present

## 2013-11-23 DIAGNOSIS — G309 Alzheimer's disease, unspecified: Secondary | ICD-10-CM | POA: Diagnosis not present

## 2013-11-23 DIAGNOSIS — W19XXXA Unspecified fall, initial encounter: Secondary | ICD-10-CM

## 2013-11-23 DIAGNOSIS — F0281 Dementia in other diseases classified elsewhere with behavioral disturbance: Secondary | ICD-10-CM | POA: Insufficient documentation

## 2013-11-23 DIAGNOSIS — Z86711 Personal history of pulmonary embolism: Secondary | ICD-10-CM | POA: Diagnosis not present

## 2013-11-23 DIAGNOSIS — Z973 Presence of spectacles and contact lenses: Secondary | ICD-10-CM | POA: Diagnosis not present

## 2013-11-23 DIAGNOSIS — F0391 Unspecified dementia with behavioral disturbance: Secondary | ICD-10-CM

## 2013-11-23 MED ORDER — LIDOCAINE HCL 2 % IJ SOLN
INTRAMUSCULAR | Status: AC
  Administered 2013-11-23: 16:00:00
  Filled 2013-11-23: qty 20

## 2013-11-23 NOTE — ED Notes (Signed)
Family at bedside and informed of plan of care. 

## 2013-11-23 NOTE — ED Notes (Signed)
Suture cart is at the bedside set up and ready for the doctor to use. 

## 2013-11-23 NOTE — Discharge Instructions (Signed)
Sutured Wound Care Sutures are stitches that can be used to close wounds. Wound care helps prevent pain and infection.  HOME CARE INSTRUCTIONS   Rest and elevate the injured area until all the pain and swelling are gone.  Only take over-the-counter or prescription medicines for pain, discomfort, or fever as directed by your caregiver.  After 48 hours, gently wash the area with mild soap and water once a day, or as directed. Rinse off the soap. Pat the area dry with a clean towel. Do not rub the wound. This may cause bleeding.  Follow your caregiver's instructions for how often to change the bandage (dressing). Stop using a dressing after 2 days or after the wound stops draining.  If the dressing sticks, moisten it with soapy water and gently remove it.  Apply ointment on the wound as directed.  Avoid stretching a sutured wound.  Drink enough fluids to keep your urine clear or pale yellow.  Follow up with your caregiver for suture removal as directed.  Use sunscreen on your wound for the next 3 to 6 months so the scar will not darken. SEEK IMMEDIATE MEDICAL CARE IF:   Your wound becomes red, swollen, hot, or tender.  You have increasing pain in the wound.  You have a red streak that extends from the wound.  There is pus coming from the wound.  You have a fever.  You have shaking chills.  There is a bad smell coming from the wound.  You have persistent bleeding from the wound. MAKE SURE YOU:   Understand these instructions.  Will watch your condition.  Will get help right away if you are not doing well or get worse. Document Released: 03/04/2004 Document Revised: 04/19/2011 Document Reviewed: 05/31/2010 [facility] Patient Information 2015 [place], [place]. This information is not intended to replace advice given to you by your health care provider. Make sure you discuss any questions you have with your health care provider. Facial Laceration  A facial laceration is a cut  on the face. These injuries can be painful and cause bleeding. Lacerations usually heal quickly, but they need special care to reduce scarring. DIAGNOSIS  Your health care provider will take a medical history, ask for details about how the injury occurred, and examine the wound to determine how deep the cut is. TREATMENT  Some facial lacerations may not require closure. Others may not be able to be closed because of an increased risk of infection. The risk of infection and the chance for successful closure will depend on various factors, including the amount of time since the injury occurred. The wound may be cleaned to help prevent infection. If closure is appropriate, pain medicines may be given if needed. Your health care provider will use stitches (sutures), wound glue (adhesive), or skin adhesive strips to repair the laceration. These tools bring the skin edges together to allow for faster healing and a better cosmetic outcome. If needed, you may also be given a tetanus shot. HOME CARE INSTRUCTIONS  Only take over-the-counter or prescription medicines as directed by your health care provider.  Follow your health care provider's instructions for wound care. These instructions will vary depending on the technique used for closing the wound. For Sutures:  Keep the wound clean and dry.   If you were given a bandage (dressing), you should change it at least once a day. Also change the dressing if it becomes wet or dirty, or as directed by your health care provider.   Wash  the wound with soap and water 2 times a day. Rinse the wound off with water to remove all soap. Pat the wound dry with a clean towel.   After cleaning, apply a thin layer of the antibiotic ointment recommended by your health care provider. This will help prevent infection and keep the dressing from sticking.   You may shower as usual after the first 24 hours. Do not soak the wound in water until the sutures are removed.    Get your sutures removed as directed by your health care provider. With facial lacerations, sutures should usually be taken out after 4-5 days to avoid stitch marks.   Wait a few days after your sutures are removed before applying any makeup. For Skin Adhesive Strips:  Keep the wound clean and dry.   Do not get the skin adhesive strips wet. You may bathe carefully, using caution to keep the wound dry.   If the wound gets wet, pat it dry with a clean towel.   Skin adhesive strips will fall off on their own. You may trim the strips as the wound heals. Do not remove skin adhesive strips that are still stuck to the wound. They will fall off in time.  For Wound Adhesive:  You may briefly wet your wound in the shower or bath. Do not soak or scrub the wound. Do not swim. Avoid periods of heavy sweating until the skin adhesive has fallen off on its own. After showering or bathing, gently pat the wound dry with a clean towel.   Do not apply liquid medicine, cream medicine, ointment medicine, or makeup to your wound while the skin adhesive is in place. This may loosen the film before your wound is healed.   If a dressing is placed over the wound, be careful not to apply tape directly over the skin adhesive. This may cause the adhesive to be pulled off before the wound is healed.   Avoid prolonged exposure to sunlight or tanning lamps while the skin adhesive is in place.  The skin adhesive will usually remain in place for 5-10 days, then naturally fall off the skin. Do not pick at the adhesive film.  After Healing: Once the wound has healed, cover the wound with sunscreen during the day for 1 full year. This can help minimize scarring. Exposure to ultraviolet light in the first year will darken the scar. It can take 1-2 years for the scar to lose its redness and to heal completely.  SEEK IMMEDIATE MEDICAL CARE IF:  You have redness, pain, or swelling around the wound.   You see  ayellowish-white fluid (pus) coming from the wound.   You have chills or a fever.  MAKE SURE YOU:  Understand these instructions.  Will watch your condition.  Will get help right away if you are not doing well or get worse. Document Released: 03/04/2004 Document Revised: 11/15/2012 Document Reviewed: 09/07/2012 [facility] Patient Information 2015 [place], [place]. This information is not intended to replace advice given to you by your health care provider. Make sure you discuss any questions you have with your health care provider.

## 2013-11-23 NOTE — ED Notes (Signed)
[address] out of wheelchair striking face on floor.  No LOC.  Laceration and ecchymosis to forehead and nose. C-collar in place

## 2013-11-23 NOTE — ED Provider Notes (Signed)
CSN: [id number]     Arrival date & time 11/23/13  1118 History   First MD Initiated Contact with Patient 11/23/13 1221     Chief Complaint  Patient presents with  . Fall     (Consider location/radiation/quality/duration/timing/severity/associated sxs/prior Treatment) Patient is a 80 y.o. female presenting with fall. The history is provided by a relative, the nursing home and the EMS personnel. No language interpreter was used.  Fall This is a new problem. The current episode started today. Associated symptoms comments: Per EMS report by nursing home staff, the patient was sitting in a wheelchair and fell out onto the floor injuring her face and head. No LOC. She has a significant history of dementia and is reported to be at her baseline. .    Past Medical History  Diagnosis Date  . Alzheimer disease   . Hypothyroidism   . A-fib   . Depression   . Anxiety   . Insomnia   . Renal disorder   . Chronic kidney disease, stage 3   . Wears glasses   . Full dentures   . Arthritis   . History of pulmonary embolism 2006  . GERD (gastroesophageal reflux disease)   . Hypertension   . HOH (hard of hearing)   . Urinary incontinence   . Diabetes mellitus without complication    Past Surgical History  Procedure Laterality Date  . Mass excision Left 07/17/2013    Procedure: EXCISION OF LESION ON LEFT ANTERIOR NECK;  Surgeon: Irene Limbo, MD;  Location: [facility];  Service: Plastics;  Laterality: Left;   No family history on file. History  Substance Use Topics  . Smoking status: Never Smoker   . Smokeless tobacco: Never Used  . Alcohol Use: No   OB History   Grav Para Term Preterm Abortions TAB SAB Ect Mult Living                 Review of Systems  Unable to perform ROS: Dementia      Allergies  Review of patient's allergies indicates no known allergies.  Home Medications   Prior to Admission medications   Medication Sig Start Date End Date Taking?  Authorizing Provider  aluminum & magnesium hydroxide-simethicone (MYLANTA) 500-450-40 MG/5ML suspension Take 30 mLs by mouth every 6 (six) hours as needed for indigestion.    Historical Provider, MD  aspirin 325 MG EC tablet Take 325 mg by mouth at bedtime. Give 30 minutes before Niacin.    Historical Provider, MD  bisacodyl (DULCOLAX) 10 MG suppository Place 10 mg rectally as needed for moderate constipation (If no BM with Milk of Magnesium.).     Historical Provider, MD  calcium carbonate (OS-CAL) 600 MG TABS tablet Take 600 mg by mouth 2 (two) times daily with a meal.    Historical Provider, MD  cloNIDine (CATAPRES - DOSED IN MG/24 HR) 0.2 mg/24hr patch Place 0.2 mg onto the skin once a week.    Historical Provider, MD  digoxin (LANOXIN) 0.125 MG tablet Take 0.125 mg by mouth every other day. Hold if apical HR <60    Historical Provider, MD  divalproex (DEPAKOTE SPRINKLE) 125 MG capsule Take 500 mg by mouth 2 (two) times daily. For mood disorder.    Historical Provider, MD  donepezil (ARICEPT) 5 MG tablet Take 5 mg by mouth at bedtime.    Historical Provider, MD  escitalopram (LEXAPRO) 20 MG tablet Take 20 mg by mouth daily.    Historical Provider, MD  HYDROcodone-acetaminophen (VICODIN) 5-500 MG per tablet Take 0.5 tablets by mouth 2 (two) times daily as needed for pain.    Historical Provider, MD  levothyroxine (SYNTHROID, LEVOTHROID) 75 MCG tablet Take 75 mcg by mouth daily before breakfast.    Historical Provider, MD  magnesium hydroxide (MILK OF MAGNESIA) 400 MG/5ML suspension Take 30 mLs by mouth daily as needed (If no BM for 3 days.).     Historical Provider, MD  memantine (NAMENDA) 10 MG tablet Take 10 mg by mouth 2 (two) times daily.    Historical Provider, MD  metoprolol tartrate (LOPRESSOR) 25 MG tablet Take 25 mg by mouth 2 (two) times daily. Hold HR less than 60    Historical Provider, MD  Multiple Vitamins-Minerals (MULTIVITAMIN WITH MINERALS) tablet Take 1 tablet by mouth daily.     Historical Provider, MD  PRESCRIPTION MEDICATION Take 120 mLs by mouth 4 (four) times daily. Med Pass for weight maintenance.    Historical Provider, MD  ranitidine (ZANTAC) 150 MG capsule Take 150 mg by mouth 2 (two) times daily.    Historical Provider, MD  temazepam (RESTORIL) 7.5 MG capsule Take 1 capsule (7.5 mg total) by mouth at bedtime as needed for sleep. 05/30/13   Lauree Chandler, NP  Vitamin D, Ergocalciferol, (DRISDOL) 50000 UNITS CAPS capsule Take 50,000 Units by mouth every 30 (thirty) days.    Historical Provider, MD  Vitamins A & D (VITAMIN A & D) ointment Apply 1 application topically at bedtime. Apply to lower extremities every night for dry skin.    Historical Provider, MD   BP 141/65  Pulse 60  Temp(Src) 97.8 F (36.6 C) (Oral)  Resp 16  SpO2 97% Physical Exam  Constitutional: She appears well-developed and well-nourished.  Frail elderly woman. The patient does not able to participate in exam most likely secondary to baseline dementia.   HENT:  Significant hematoma forehead with central laceration. There is swelling and bruising to bridge of nose with 2 cm laceration. No active bleeding. No other facial injury.   Eyes: Conjunctivae are normal.  Pulmonary/Chest: Effort normal. She exhibits no tenderness.  Chest wall appears atraumatic.   Abdominal: Soft.  Abdominal wall appears atraumatic.   Musculoskeletal:  No bony deformities or evidence of tenderness to extremities.  Skin:  Multiple extremity bruising in various stages of healing.    ED Course  Procedures (including critical care time) Labs Review Labs Reviewed - No data to display  Imaging Review No results found.   EKG Interpretation None     Ct Head Wo Contrast  11/23/2013   CLINICAL DATA:  Head injury after fall.  EXAM: CT HEAD WITHOUT CONTRAST  CT MAXILLOFACIAL WITHOUT CONTRAST  CT CERVICAL SPINE WITHOUT CONTRAST  TECHNIQUE: Multidetector CT imaging of the head, cervical spine, and maxillofacial  structures were performed using the standard protocol without intravenous contrast. Multiplanar CT image reconstructions of the cervical spine and maxillofacial structures were also generated.  COMPARISON:  CT scan of March 05, 2013.  FINDINGS: CT HEAD FINDINGS  Left frontal scalp hematoma is noted. Bony calvarium appears otherwise intact. Diffuse cortical atrophy is noted. Mild chronic ischemic white matter disease is noted. No mass effect or midline shift is noted. Ventricular size is within normal limits. There is no evidence of mass lesion, hemorrhage or acute infarction.  CT MAXILLOFACIAL FINDINGS  Mildly depressed nasal bone fracture is noted. No other fracture or other bony abnormality is noted. Paranasal sinuses appear normal. Ostiomeatal complexes appear widely patent. Pterygoid plates  appear normal. Globes and orbits appear normal.  CT CERVICAL SPINE FINDINGS  No definite fracture is noted grade 1 retrolisthesis is noted at C3-4 secondary to degenerative disc disease at this level. Grade 1 retrolisthesis is also noted at C4-5 secondary to degenerative disc disease. Hypertrophy of the posterior facet joints is seen at multiple levels secondary to degenerative joint disease.  IMPRESSION: Small left frontal scalp hematoma. Diffuse cortical atrophy. Mild chronic ischemic white matter disease. No acute intracranial abnormality seen.  Mildly depressed nasal bone fracture is noted. No other significant abnormality seen in the maxillofacial region.  Degenerative changes are noted throughout the cervical spine. No fracture or other acute abnormality is noted.   Electronically Signed   By: Sabino Dick M.D.   On: 11/23/2013 13:55   Ct Cervical Spine Wo Contrast  11/23/2013   CLINICAL DATA:  Head injury after fall.  EXAM: CT HEAD WITHOUT CONTRAST  CT MAXILLOFACIAL WITHOUT CONTRAST  CT CERVICAL SPINE WITHOUT CONTRAST  TECHNIQUE: Multidetector CT imaging of the head, cervical spine, and maxillofacial structures  were performed using the standard protocol without intravenous contrast. Multiplanar CT image reconstructions of the cervical spine and maxillofacial structures were also generated.  COMPARISON:  CT scan of March 05, 2013.  FINDINGS: CT HEAD FINDINGS  Left frontal scalp hematoma is noted. Bony calvarium appears otherwise intact. Diffuse cortical atrophy is noted. Mild chronic ischemic white matter disease is noted. No mass effect or midline shift is noted. Ventricular size is within normal limits. There is no evidence of mass lesion, hemorrhage or acute infarction.  CT MAXILLOFACIAL FINDINGS  Mildly depressed nasal bone fracture is noted. No other fracture or other bony abnormality is noted. Paranasal sinuses appear normal. Ostiomeatal complexes appear widely patent. Pterygoid plates appear normal. Globes and orbits appear normal.  CT CERVICAL SPINE FINDINGS  No definite fracture is noted grade 1 retrolisthesis is noted at C3-4 secondary to degenerative disc disease at this level. Grade 1 retrolisthesis is also noted at C4-5 secondary to degenerative disc disease. Hypertrophy of the posterior facet joints is seen at multiple levels secondary to degenerative joint disease.  IMPRESSION: Small left frontal scalp hematoma. Diffuse cortical atrophy. Mild chronic ischemic white matter disease. No acute intracranial abnormality seen.  Mildly depressed nasal bone fracture is noted. No other significant abnormality seen in the maxillofacial region.  Degenerative changes are noted throughout the cervical spine. No fracture or other acute abnormality is noted.   Electronically Signed   By: Sabino Dick M.D.   On: 11/23/2013 13:55   Ct Maxillofacial Wo Cm  11/23/2013   CLINICAL DATA:  Head injury after fall.  EXAM: CT HEAD WITHOUT CONTRAST  CT MAXILLOFACIAL WITHOUT CONTRAST  CT CERVICAL SPINE WITHOUT CONTRAST  TECHNIQUE: Multidetector CT imaging of the head, cervical spine, and maxillofacial structures were performed  using the standard protocol without intravenous contrast. Multiplanar CT image reconstructions of the cervical spine and maxillofacial structures were also generated.  COMPARISON:  CT scan of March 05, 2013.  FINDINGS: CT HEAD FINDINGS  Left frontal scalp hematoma is noted. Bony calvarium appears otherwise intact. Diffuse cortical atrophy is noted. Mild chronic ischemic white matter disease is noted. No mass effect or midline shift is noted. Ventricular size is within normal limits. There is no evidence of mass lesion, hemorrhage or acute infarction.  CT MAXILLOFACIAL FINDINGS  Mildly depressed nasal bone fracture is noted. No other fracture or other bony abnormality is noted. Paranasal sinuses appear normal. Ostiomeatal complexes appear widely patent. Pterygoid  plates appear normal. Globes and orbits appear normal.  CT CERVICAL SPINE FINDINGS  No definite fracture is noted grade 1 retrolisthesis is noted at C3-4 secondary to degenerative disc disease at this level. Grade 1 retrolisthesis is also noted at C4-5 secondary to degenerative disc disease. Hypertrophy of the posterior facet joints is seen at multiple levels secondary to degenerative joint disease.  IMPRESSION: Small left frontal scalp hematoma. Diffuse cortical atrophy. Mild chronic ischemic white matter disease. No acute intracranial abnormality seen.  Mildly depressed nasal bone fracture is noted. No other significant abnormality seen in the maxillofacial region.  Degenerative changes are noted throughout the cervical spine. No fracture or other acute abnormality is noted.   Electronically Signed   By: Sabino Dick M.D.   On: 11/23/2013 13:55  LACERATION REPAIR Performed by: Charlann Lange A Authorized by: Charlann Lange A Consent: Verbal consent obtained. Risks and benefits: risks, benefits and alternatives were discussed Consent given by: patient Patient identity confirmed: provided demographic data Prepped and Draped in normal sterile  fashion Wound explored  Laceration Location: nose  Laceration Length: 2cm  No Foreign Bodies seen or palpated  Anesthesia: local infiltration  Local anesthetic: lidocaine 2% w/o epinephrine  Anesthetic total: 2 ml  Irrigation method: syringe Amount of cleaning: standard  Skin closure: 6-0 prolene  Number of sutures: 4  Technique: simple interrupted  Patient tolerance: Patient tolerated the procedure well with no immediate complications.  LACERATION REPAIR Performed by: Charlann Lange A Authorized by: Charlann Lange A Consent: Verbal consent obtained. Risks and benefits: risks, benefits and alternatives were discussed Consent given by: patient Patient identity confirmed: provided demographic data Prepped and Draped in normal sterile fashion Wound explored  Laceration Location: forehead  Laceration Length: 1.5cm  No Foreign Bodies seen or palpated  Anesthesia: local infiltration  Local anesthetic: lidocaine 2% w/o epinephrine  Anesthetic total: 2 ml  Irrigation method: syringe Amount of cleaning: standard  Skin closure: 6-0 prolene  Number of sutures: 2  Technique: simple interrupted  Patient tolerance: Patient tolerated the procedure well with no immediate complications.  MDM   Final diagnoses:  None    1. fall 2. Facial lacerations 3. Dementia  Mental status unchanged. Son, at bedside, reports this is baseline for patient. Imagining negative for intracranial bleed or bony injury. Lacerations repaired. Patient stable for transport back to nursing home. Care instructions provided.   Dewaine Oats, PA-C 11/23/13 [phone]

## 2013-11-23 NOTE — ED Provider Notes (Signed)
Medical screening examination/treatment/procedure(s) were conducted as a shared visit with non-physician practitioner(s) and myself.  I personally evaluated the patient during the encounter.   EKG Interpretation None     Pt with facial lacerations s/p fall.  Pt seen and evaluated, imaging studies negative, wounds repaired by PA [place].    Threasa Beards, MD 11/23/13 [phone]

## 2013-11-23 NOTE — ED Notes (Signed)
Patient did not have an arm band.  RN informed.

## 2013-11-23 NOTE — ED Notes (Signed)
[facility] here for transport back to [facility] and [place] and d/c instructions given

## 2013-11-30 ENCOUNTER — Non-Acute Institutional Stay (SKILLED_NURSING_FACILITY): Payer: PRIVATE HEALTH INSURANCE | Admitting: Internal Medicine

## 2013-11-30 DIAGNOSIS — F0391 Unspecified dementia with behavioral disturbance: Secondary | ICD-10-CM

## 2013-11-30 DIAGNOSIS — J189 Pneumonia, unspecified organism: Secondary | ICD-10-CM

## 2013-11-30 DIAGNOSIS — F03918 Unspecified dementia, unspecified severity, with other behavioral disturbance: Secondary | ICD-10-CM

## 2013-11-30 NOTE — Progress Notes (Signed)
MRN: [id number] Name: [name]  Sex: female Age: 80 y.o. DOB: 1920-12-15  [facility]: [facility] Facility/Room:  Level Of Care: SNF Provider: Inocencio Homes D Emergency Contacts: Extended Emergency Contact Information Primary Emergency Contact: [facility] Address: [address]          [place] [address] Johnnette Litter of [place] Phone: [phone] Mobile Phone: [phone] Relation: Son Secondary Emergency Contact: Semrad,Marty Address: [address]          [place], [place] [address] Johnnette Litter of [place] Phone: [phone] Mobile Phone: [phone] Relation: Son  Code Status: DNR  Allergies: Review of patient's allergies indicates no known allergies.  Chief Complaint  Patient presents with  . Acute Visit    HPI: Patient is 80 y.o. female who nursing asked me to see for MS change and SOB.  Past Medical History  Diagnosis Date  . Alzheimer disease   . Hypothyroidism   . A-fib   . Depression   . Anxiety   . Insomnia   . Renal disorder   . Chronic kidney disease, stage 3   . Wears glasses   . Full dentures   . Arthritis   . History of pulmonary embolism 2006  . GERD (gastroesophageal reflux disease)   . Hypertension   . HOH (hard of hearing)   . Urinary incontinence   . Diabetes mellitus without complication     Past Surgical History  Procedure Laterality Date  . Mass excision Left 07/17/2013    Procedure: EXCISION OF LESION ON LEFT ANTERIOR NECK;  Surgeon: Irene Limbo, MD;  Location: [place] [facility];  Service: Plastics;  Laterality: Left;      Medication List       This list is accurate as of: 11/30/13 11:59 PM.  Always use your most recent med list.               aluminum & magnesium hydroxide-simethicone 500-450-40 MG/5ML suspension  Commonly known as:  MYLANTA  Take 30 mLs by mouth every 6 (six) hours as needed for indigestion.     aspirin 325 MG EC tablet  Take 325 mg by mouth at bedtime. Give 30  minutes before Niacin.     bisacodyl 10 MG suppository  Commonly known as:  DULCOLAX  Place 10 mg rectally as needed for moderate constipation (If no BM with Milk of Magnesium.).     calcium carbonate 600 MG Tabs tablet  Commonly known as:  OS-CAL  Take 600 mg by mouth 2 (two) times daily with a meal.     cloNIDine 0.2 mg/24hr patch  Commonly known as:  CATAPRES - Dosed in mg/24 hr  Place 0.2 mg onto the skin once a week.     digoxin 0.125 MG tablet  Commonly known as:  LANOXIN  Take 0.125 mg by mouth every other day. Hold if apical HR <60     divalproex 125 MG capsule  Commonly known as:  DEPAKOTE SPRINKLE  Take 500 mg by mouth 2 (two) times daily. For mood disorder.     donepezil 5 MG tablet  Commonly known as:  ARICEPT  Take 5 mg by mouth at bedtime.     escitalopram 20 MG tablet  Commonly known as:  LEXAPRO  Take 20 mg by mouth daily.     HYDROcodone-acetaminophen 5-500 MG per tablet  Commonly known as:  VICODIN  Take 0.5 tablets by mouth 2 (two) times daily as needed for pain.  levothyroxine 75 MCG tablet  Commonly known as:  SYNTHROID, LEVOTHROID  Take 75 mcg by mouth daily before breakfast.     magnesium hydroxide 400 MG/5ML suspension  Commonly known as:  MILK OF MAGNESIA  Take 30 mLs by mouth daily as needed (If no BM for 3 days.).     memantine 10 MG tablet  Commonly known as:  NAMENDA  Take 10 mg by mouth 2 (two) times daily.     metoprolol tartrate 25 MG tablet  Commonly known as:  LOPRESSOR  Take 25 mg by mouth 2 (two) times daily. Hold HR less than 60     multivitamin with minerals tablet  Take 1 tablet by mouth daily.     PRESCRIPTION MEDICATION  Take 120 mLs by mouth 4 (four) times daily. Med Pass for weight maintenance.     ranitidine 150 MG capsule  Commonly known as:  ZANTAC  Take 150 mg by mouth 2 (two) times daily.     temazepam 7.5 MG capsule  Commonly known as:  RESTORIL  Take 1 capsule (7.5 mg total) by mouth at bedtime as  needed for sleep.     vitamin A & D ointment  Apply 1 application topically at bedtime. Apply to lower extremities every night for dry skin.     Vitamin D (Ergocalciferol) 50000 UNITS Caps capsule  Commonly known as:  DRISDOL  Take 50,000 Units by mouth every 30 (thirty) days.        No orders of the defined types were placed in this encounter.    Immunization History  Administered Date(s) Administered  . Influenza Whole 11/21/2006, 11/09/2012  . Tdap 03/05/2013    History  Substance Use Topics  . Smoking status: Never Smoker   . Smokeless tobacco: Never Used  . Alcohol Use: No    Review of Systems  Per nursing-found with MS change this am and increased RR; pt with baseline dementia    Filed Vitals:   11/30/13 1232  BP: 117/71  Pulse: 130  Temp: 102 F (38.9 C)  Resp: 28    Physical Exam  GENERAL APPEARANCE: obtunded  SKIN: No diaphoresis rash, or wounds HEENT: Unremarkable RESPIRATORY: Breathing is labored, increased RR; Lung sounds are rhonchorus; open mouthed breathing with weak cough CARDIOVASCULAR: Heart rate inc no murmurs, rubs or gallops. No peripheral edema  GASTROINTESTINAL: Abdomen is soft, non-tender, not distended w/ normal bowel sounds.  GENITOURINARY: Bladder non tender, not distended  MUSCULOSKELETAL:  contractures NEUROLOGIC: Cranial nerves 2-12 grossly intact. PSYCHIATRIC: unable  Patient Active Problem List   Diagnosis Date Noted  . HCAP (healthcare-associated pneumonia) 11/30/2013  . PVD (peripheral vascular disease) 10/27/2013  . Diabetes mellitus without complication   . Alzheimer's disease 08/22/2012  . Unspecified hypothyroidism 08/22/2012  . Chronic kidney disease, stage III (moderate) 08/22/2012  . PRESSURE ULCER STAGE I 07/14/2007  . EDEMA 07/14/2007  . UTI 06/09/2007  . Dementia with behavioral disturbance 03/14/2007  . WEIGHT LOSS, ABNORMAL 03/14/2007  . ATRIAL FIBRILLATION, PAROXYSMAL 01/03/2007  . BACK PAIN 09/30/2006   . HYPERLIPIDEMIA 06/01/2006  . HYPERTENSION 06/01/2006  . GERD 06/01/2006  . OSTEOARTHRITIS 06/01/2006  . OSTEOPOROSIS 06/01/2006  . HIP FRACTURE, LEFT 06/01/2006  . PULMONARY EMBOLISM, HX OF 06/01/2006    CBC    Component Value Date/Time   WBC 7.7 08/21/2007 0540   RBC 3.61* 08/21/2007 0540   HGB 11.6* 08/21/2007 0540   HCT 34.5* 08/21/2007 0540   PLT 299 08/21/2007 0540   MCV 95.4 08/21/2007  0540   LYMPHSABS 1.4 08/07/2007 0330   MONOABS 0.2 08/07/2007 0330   EOSABS 0.0 08/07/2007 0330   BASOSABS 0.0 08/07/2007 0330    CMP     Component Value Date/Time   NA 139 08/19/2007 1110   K 4.2 08/19/2007 1110   CL 108 08/19/2007 1110   CO2 23 08/19/2007 1110   GLUCOSE 85 08/19/2007 1110   BUN 26* 08/19/2007 1110   CREATININE 1.11 08/19/2007 1110   CALCIUM 8.6 08/19/2007 1110   PROT 6.5 08/06/2007 0525   ALBUMIN 2.5* 08/06/2007 0525   AST 40* 08/06/2007 0525   ALT 29 08/06/2007 0525   ALKPHOS 65 08/06/2007 0525   BILITOT 0.6 08/06/2007 0525   GFRNONAA 47* 08/19/2007 1110   GFRAA  Value: 57        The eGFR has been calculated using the MDRD equation. This calculation has not been validated in all clinical* 08/19/2007 1110    Assessment and Plan  HCAP (healthcare-associated pneumonia) Onset today with MS change and resp distress.Pt is [employer] so nurse was called just before I was asked to see pt. I ordered O2 and IVF with NS increased to 100cc/hr for 2 liters. Rocepin 2 gm IM now and 1gm IM daily for next 6 days. CXR showed LLL infiltrate;pt to be positoined on R side. Pt is a DNR, do not hospitalize.     Hennie Duos, MD

## 2013-11-30 NOTE — Assessment & Plan Note (Addendum)
Onset today with MS change and resp distress.Pt is [employer] so nurse was called just before I was asked to see pt. I ordered O2 and IVF with NS increased to 100cc/hr for 2 liters. Rocepin 2 gm IM now and 1gm IM daily for next 6 days. CXR showed LLL infiltrate;pt to be positoined on R side. Pt is a DNR, do not hospitalize.

## 2013-12-01 ENCOUNTER — Encounter: Payer: Self-pay | Admitting: Internal Medicine

## 2013-12-02 ENCOUNTER — Encounter: Payer: Self-pay | Admitting: Internal Medicine

## 2013-12-02 NOTE — Assessment & Plan Note (Signed)
Baseline is severe dementia

## 2013-12-03 ENCOUNTER — Other Ambulatory Visit: Payer: Self-pay | Admitting: *Deleted

## 2013-12-03 MED ORDER — MORPHINE SULFATE 20 MG/5ML PO SOLN: ORAL | Status: AC

## 2013-12-03 NOTE — Telephone Encounter (Signed)
[employer]

## 2013-12-09 DEATH — deceased

## 2015-06-14 IMAGING — CT CT MAXILLOFACIAL W/O CM
2 of 6 series · 4 of 16 positions shown, 5 images · non-contrast
Comparison: CT scan of March 05, 2013.

CLINICAL DATA: Head injury after fall.

EXAM:
CT HEAD WITHOUT CONTRAST
CT MAXILLOFACIAL WITHOUT CONTRAST
CT CERVICAL SPINE WITHOUT CONTRAST
TECHNIQUE: Multidetector CT imaging of the head, cervical spine, and
maxillofacial structures were performed using the standard protocol
without intravenous contrast. Multiplanar CT image reconstructions
of the cervical spine and maxillofacial structures were also
generated.

[Series 11: c_spine 2.0 b41s st · axial · 0.23mm/px · z∈[-303,-257]mm · 2 of 71 slices shown, 3 images]
[im 24/71  soft-tissue]
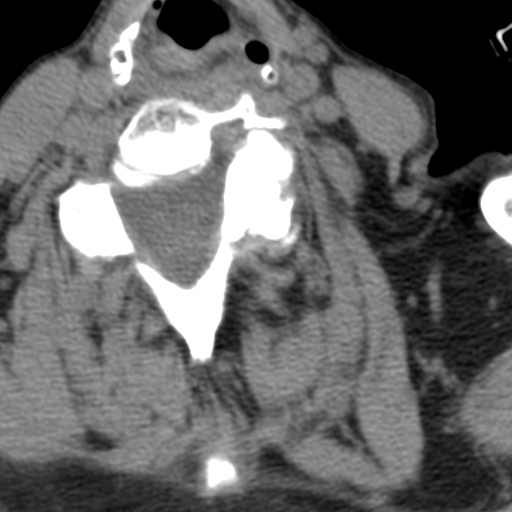
[im 24/71  bone]
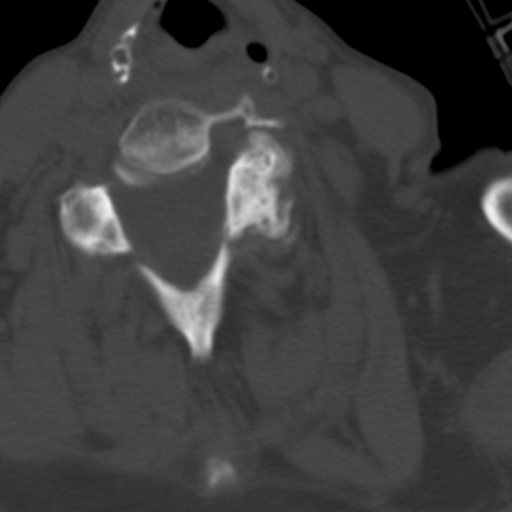
[im 47/71  bone]
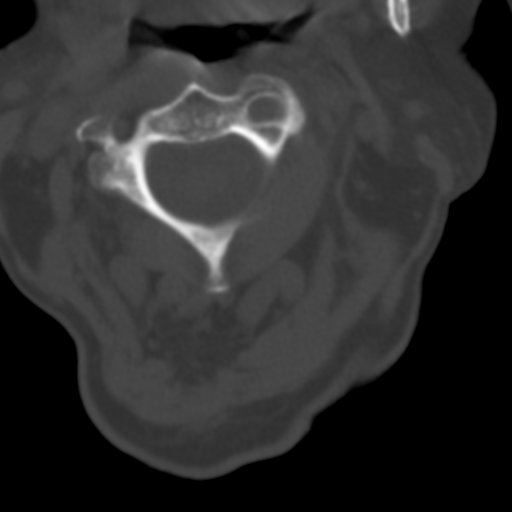

[Series 16: c_spine 2.0 orth ax · axial · 0.31mm/px · z∈[-312,-266]mm · 2 of 76 slices shown]
[im 26/76  bone]
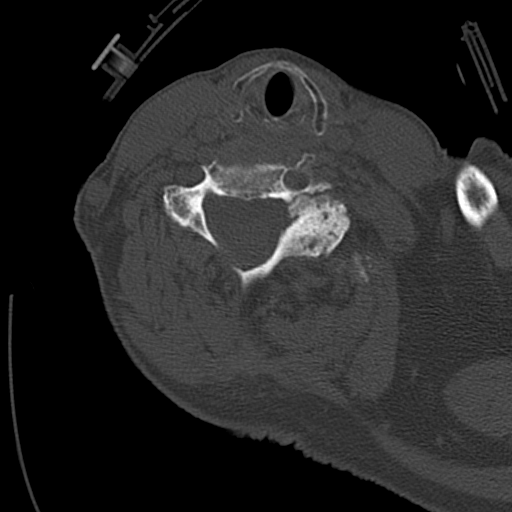
[im 51/76  bone]
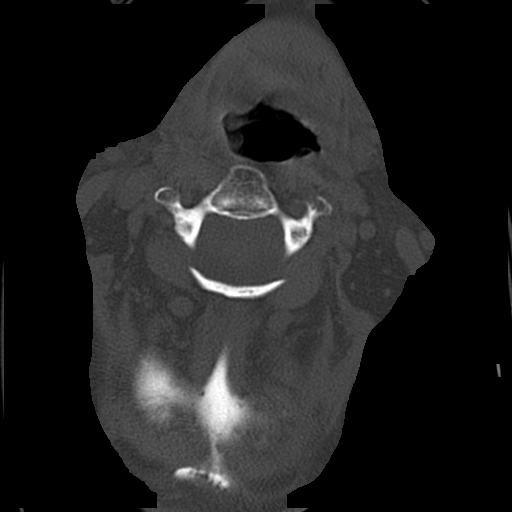

[4 of 16 positions shown; findings below may reference images not displayed]

FINDINGS: CT HEAD FINDINGS

Left frontal scalp hematoma is noted. Bony calvarium appears
otherwise intact. Diffuse cortical atrophy is noted. Mild chronic
ischemic white matter disease is noted. No mass effect or midline
shift is noted. Ventricular size is within normal limits. There is
no evidence of mass lesion, hemorrhage or acute infarction.

CT MAXILLOFACIAL FINDINGS

Mildly depressed nasal bone fracture is noted. No other fracture or
other bony abnormality is noted. Paranasal sinuses appear normal.
Ostiomeatal complexes appear widely patent. Pterygoid plates appear
normal. Globes and orbits appear normal.

CT CERVICAL SPINE FINDINGS

No definite fracture is noted grade 1 retrolisthesis is noted at
C3-4 secondary to degenerative disc disease at this level. Grade 1
retrolisthesis is also noted at C4-5 secondary to degenerative disc
disease. Hypertrophy of the posterior facet joints is seen at
multiple levels secondary to degenerative joint disease.
IMPRESSION: Small left frontal scalp hematoma. Diffuse cortical atrophy. Mild
chronic ischemic white matter disease. No acute intracranial
abnormality seen.

Mildly depressed nasal bone fracture is noted. No other significant
abnormality seen in the maxillofacial region.

Degenerative changes are noted throughout the cervical spine. No
fracture or other acute abnormality is noted.
